# Patient Record
Sex: Female | Born: 1965 | Marital: Married | Smoking: Never smoker
Health system: Southern US, Community
[De-identification: ages and names within clinical notes are randomized; demographics above are authoritative.]

## PROBLEM LIST (undated history)

## (undated) DIAGNOSIS — E119 Type 2 diabetes mellitus without complications: Secondary | ICD-10-CM

## (undated) DIAGNOSIS — L709 Acne, unspecified: Secondary | ICD-10-CM

## (undated) DIAGNOSIS — S322XXA Fracture of coccyx, initial encounter for closed fracture: Secondary | ICD-10-CM

## (undated) DIAGNOSIS — M79671 Pain in right foot: Secondary | ICD-10-CM

## (undated) DIAGNOSIS — Z8719 Personal history of other diseases of the digestive system: Secondary | ICD-10-CM

## (undated) DIAGNOSIS — M75112 Incomplete rotator cuff tear or rupture of left shoulder, not specified as traumatic: Secondary | ICD-10-CM

## (undated) DIAGNOSIS — M6530 Trigger finger, unspecified finger: Secondary | ICD-10-CM

## (undated) DIAGNOSIS — M25561 Pain in right knee: Secondary | ICD-10-CM

## (undated) DIAGNOSIS — B009 Herpesviral infection, unspecified: Secondary | ICD-10-CM

## (undated) DIAGNOSIS — Z86010 Personal history of colonic polyps: Secondary | ICD-10-CM

## (undated) DIAGNOSIS — E785 Hyperlipidemia, unspecified: Secondary | ICD-10-CM

## (undated) DIAGNOSIS — K603 Anal fistula: Secondary | ICD-10-CM

## (undated) DIAGNOSIS — G8929 Other chronic pain: Secondary | ICD-10-CM

## (undated) DIAGNOSIS — Z8639 Personal history of other endocrine, nutritional and metabolic disease: Secondary | ICD-10-CM

## (undated) DIAGNOSIS — J383 Other diseases of vocal cords: Secondary | ICD-10-CM

## (undated) DIAGNOSIS — F419 Anxiety disorder, unspecified: Secondary | ICD-10-CM

## (undated) DIAGNOSIS — M542 Cervicalgia: Secondary | ICD-10-CM

## (undated) DIAGNOSIS — M7520 Bicipital tendinitis, unspecified shoulder: Secondary | ICD-10-CM

## (undated) DIAGNOSIS — G5603 Carpal tunnel syndrome, bilateral upper limbs: Secondary | ICD-10-CM

## (undated) DIAGNOSIS — M79672 Pain in left foot: Secondary | ICD-10-CM

## (undated) DIAGNOSIS — E049 Nontoxic goiter, unspecified: Secondary | ICD-10-CM

## (undated) HISTORY — PX: ABDOMINAL HYSTERECTOMY: SHX81

---
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
## 1990-07-12 HISTORY — PX: TUBAL LIGATION: SHX77

## 2007-07-13 HISTORY — PX: CHOLECYSTECTOMY: SHX55

## 2008-07-12 HISTORY — PX: GASTRIC BYPASS: SHX52

## 2014-07-12 HISTORY — PX: OTHER SURGICAL HISTORY: SHX169

## 2015-03-25 DIAGNOSIS — M75110 Incomplete rotator cuff tear or rupture of unspecified shoulder, not specified as traumatic: Secondary | ICD-10-CM | POA: Insufficient documentation

## 2015-04-17 DIAGNOSIS — S93431A Sprain of tibiofibular ligament of right ankle, initial encounter: Secondary | ICD-10-CM | POA: Insufficient documentation

## 2015-04-17 DIAGNOSIS — S8002XA Contusion of left knee, initial encounter: Secondary | ICD-10-CM | POA: Insufficient documentation

## 2015-07-13 HISTORY — PX: SHOULDER SURGERY: SHX246

## 2015-07-22 DIAGNOSIS — E119 Type 2 diabetes mellitus without complications: Secondary | ICD-10-CM | POA: Insufficient documentation

## 2015-08-02 DIAGNOSIS — M7522 Bicipital tendinitis, left shoulder: Secondary | ICD-10-CM | POA: Insufficient documentation

## 2016-07-12 HISTORY — PX: COLONOSCOPY W/ POLYPECTOMY: SHX1380

## 2017-09-13 DIAGNOSIS — M65352 Trigger finger, left little finger: Secondary | ICD-10-CM | POA: Insufficient documentation

## 2017-09-27 DIAGNOSIS — M79642 Pain in left hand: Secondary | ICD-10-CM

## 2017-09-27 DIAGNOSIS — M79641 Pain in right hand: Secondary | ICD-10-CM | POA: Insufficient documentation

## 2017-09-27 DIAGNOSIS — G5603 Carpal tunnel syndrome, bilateral upper limbs: Secondary | ICD-10-CM | POA: Insufficient documentation

## 2017-11-29 ENCOUNTER — Encounter: Payer: Self-pay | Admitting: Sports Medicine

## 2017-11-29 ENCOUNTER — Telehealth: Payer: Self-pay | Admitting: *Deleted

## 2017-11-29 ENCOUNTER — Ambulatory Visit: Payer: BLUE CROSS/BLUE SHIELD | Admitting: Sports Medicine

## 2017-11-29 ENCOUNTER — Other Ambulatory Visit: Payer: Self-pay | Admitting: Sports Medicine

## 2017-11-29 ENCOUNTER — Ambulatory Visit (INDEPENDENT_AMBULATORY_CARE_PROVIDER_SITE_OTHER): Payer: BLUE CROSS/BLUE SHIELD

## 2017-11-29 VITALS — Resp 16

## 2017-11-29 DIAGNOSIS — E1141 Type 2 diabetes mellitus with diabetic mononeuropathy: Secondary | ICD-10-CM | POA: Diagnosis not present

## 2017-11-29 DIAGNOSIS — M7742 Metatarsalgia, left foot: Secondary | ICD-10-CM

## 2017-11-29 DIAGNOSIS — M2041 Other hammer toe(s) (acquired), right foot: Secondary | ICD-10-CM | POA: Diagnosis not present

## 2017-11-29 DIAGNOSIS — M79671 Pain in right foot: Secondary | ICD-10-CM | POA: Diagnosis not present

## 2017-11-29 DIAGNOSIS — M21619 Bunion of unspecified foot: Secondary | ICD-10-CM

## 2017-11-29 DIAGNOSIS — M2042 Other hammer toe(s) (acquired), left foot: Principal | ICD-10-CM

## 2017-11-29 DIAGNOSIS — G629 Polyneuropathy, unspecified: Secondary | ICD-10-CM

## 2017-11-29 DIAGNOSIS — M79672 Pain in left foot: Secondary | ICD-10-CM

## 2017-11-29 DIAGNOSIS — M7741 Metatarsalgia, right foot: Secondary | ICD-10-CM | POA: Diagnosis not present

## 2017-11-29 NOTE — Progress Notes (Signed)
Subjective: Diane Hancock is a 52 y.o. female patient with history of diabetes who presents to office today complaining of painful lesion on right foot and burning and throbbing pain bilateral. Patient states that the glucose reading this morning was not recorded today. Last A1c 7.3. Patient denies any new changes in medication or new problems.  Review of Systems  Neurological: Positive for sensory change.  All other systems reviewed and are negative.    Patient Active Problem List   Diagnosis Date Noted  . Bilateral hand pain 09/27/2017  . Carpal tunnel syndrome, bilateral 09/27/2017  . Trigger little finger of left hand 09/13/2017  . Biceps tendinitis of left shoulder 08/02/2015  . Diabetes mellitus type 2, uncomplicated ([place]) 38/18/2993  . Contusion of left knee 04/17/2015  . Sprain of tibiofibular ligament of right ankle 04/17/2015  . Incomplete tear of rotator cuff 03/25/2015   Current Outpatient Medications on File Prior to Visit  Medication Sig Dispense Refill  . escitalopram (LEXAPRO) 10 MG tablet Take by mouth.    . fluticasone (FLONASE) 50 MCG/ACT nasal spray     . gabapentin (NEURONTIN) 300 MG capsule Reported on 07/30/2015    . meloxicam (MOBIC) 15 MG tablet     . methimazole (TAPAZOLE) 5 MG tablet Take by mouth.    . spironolactone (ALDACTONE) 50 MG tablet   0  . valACYclovir (VALTREX) 1000 MG tablet   1   No current facility-administered medications on file prior to visit.    No Known Allergies  No results found for this or any previous visit (from the past 2160 hour(s)).  Objective: General: Patient is awake, alert, and oriented x 3 and in no acute distress.  Integument: Skin is warm, dry and supple bilateral. Nails are within normal limits bilateral. No signs of infection. No open lesions. + minimal callus right foot sub met 2. Remaining integument unremarkable.  Vasculature: Dorsalis Pedis pulse 2/4 bilateral. Posterior Tibial pulse 2 /4 bilateral. Capillary  fill time <3 sec 1-5 bilateral. Positive hair growth to the level of the digits.Temperature gradient within normal limits. No varicosities present bilateral. No edema present bilateral.   Neurology: The patient has intact sensation measured with a 5.07/10g Semmes Weinstein Monofilament at all pedal sites bilateral . Vibratory sensation diminished bilateral with tuning fork. No Babinski sign present bilateral.   Musculoskeletal: Asymptomatic bunion, mild hammertoe, and occasional pain to balls to feet noted bilateral. Muscular strength 5/5 in all lower extremity muscular groups bilateral without pain on range of motion. No tenderness with calf compression bilateral.  Xrays: Mild hammertoe, no other acute findings.   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 -Xrays reviewed  -Discussed and educated patient on diabetic foot care, especially with  regards to the vascular, neurological and musculoskeletal systems.  -Stressed the importance of good glycemic control and the detriment of not  controlling glucose levels in relation to the foot. -Dispensed toe spacer to use 4-5 toes  -Recommend diabetic insoles to help with foot pain -Rx NCVs for further eval of foot pain of burning and tingling  -Return after nerve testing  -Patient advised to call  the office if any problems or questions arise in the meantime.  Titorya Stover, DPM 

## 2017-11-29 NOTE — Telephone Encounter (Signed)
-----   Message from Asencion Islam, [place] sent at 11/29/2017  2:01 PM EDT ----- Regarding: NCV Diabetic with increasing burning pain bilateral

## 2017-11-29 NOTE — Telephone Encounter (Signed)
Faxed orders to [facility]. 

## 2017-11-30 ENCOUNTER — Other Ambulatory Visit: Payer: Self-pay | Admitting: *Deleted

## 2017-11-30 ENCOUNTER — Encounter: Payer: Self-pay | Admitting: Neurology

## 2017-11-30 DIAGNOSIS — G629 Polyneuropathy, unspecified: Secondary | ICD-10-CM

## 2017-12-08 ENCOUNTER — Ambulatory Visit (INDEPENDENT_AMBULATORY_CARE_PROVIDER_SITE_OTHER): Payer: BLUE CROSS/BLUE SHIELD | Admitting: Neurology

## 2017-12-08 DIAGNOSIS — G629 Polyneuropathy, unspecified: Secondary | ICD-10-CM | POA: Diagnosis not present

## 2017-12-08 DIAGNOSIS — M79671 Pain in right foot: Secondary | ICD-10-CM

## 2017-12-08 DIAGNOSIS — M79672 Pain in left foot: Principal | ICD-10-CM

## 2017-12-08 NOTE — Procedures (Signed)
[facility]  [address] [place], [address]  [place], [place] [address] Tel: [phone] Fax:  [phone] Test Date:  12/08/2017  Patient: Diane Hancock DOB: [birth year] Physician: Nita Sickle, DO  Sex: Female Height:  Ref Phys: Asencion Islam, DPM  ID#: [id number] Temp: 34.4C Technician:    Patient Complaints: This is a 52 year old female with diabetes referred for evaluation of bilateral feet pain.  NCV & EMG Findings: Extensive electrodiagnostic testing of the right lower extremity and additional studies of the left shows:  1. Bilateral sural and superficial peroneal sensory responses are within normal limits. 2. Bilateral tibial and peroneal motor responses are within normal limits. 3. Bilateral tibial H reflex studies are within normal limits. 4. There is no evidence of active or chronic motor axon loss changes affecting any of the tested muscles. Motor unit configuration and recruitment pattern is within normal limits.  Impression: This is a normal study of the lower extremities. In particular, there is no evidence of a large fiber sensorimotor polyneuropathy or lumbosacral radiculopathy.    ___________________________ Nita Sickle, DO    Nerve Conduction Studies Anti Sensory Summary Table   Site NR Peak (ms) Norm Peak (ms) P-T Amp (V) Norm P-T Amp  Left Sup Peroneal Anti Sensory (Ant Lat Mall)  34.4C  12 cm    3.0 <4.6 13.4 >4  Right Sup Peroneal Anti Sensory (Ant Lat Mall)  34.4C  12 cm    2.3 <4.6 14.2 >4  Left Sural Anti Sensory (Lat Mall)  34.4C  Calf    3.5 <4.6 14.8 >4  Right Sural Anti Sensory (Lat Mall)  34.4C  Calf    3.1 <4.6 14.8 >4   Motor Summary Table   Site NR Onset (ms) Norm Onset (ms) O-P Amp (mV) Norm O-P Amp Site1 Site2 Delta-0 (ms) Dist (cm) Vel (m/s) Norm Vel (m/s)  Left Peroneal Motor (Ext Dig Brev)  34.4C  Ankle    3.7 <6.0 7.2 >2.5 B Fib Ankle 7.3 39.0 53 >40  B Fib    11.0  6.4  Poplt B Fib 1.5 9.0 60 >40  Poplt     12.5  6.4         Right Peroneal Motor (Ext Dig Brev)  34.4C  Ankle    3.0 <6.0 6.7 >2.5 B Fib Ankle 8.1 39.0 48 >40  B Fib    11.1  5.8  Poplt B Fib 1.6 8.0 50 >40  Poplt    12.7  5.5         Left Tibial Motor (Abd Hall Brev)  34.4C  Ankle    4.1 <6.0 8.5 >4 Knee Ankle 9.7 41.0 42 >40  Knee    13.8  5.6         Right Tibial Motor (Abd Hall Brev)  34.4C  Ankle    4.1 <6.0 10.2 >4 Knee Ankle 8.7 40.0 46 >40  Knee    12.8  7.7          H Reflex Studies   NR H-Lat (ms) Lat Norm (ms) L-R H-Lat (ms)  Left Tibial (Gastroc)  34.4C     34.01 <35 3.67  Right Tibial (Gastroc)  34.4C     30.34 <35 3.67   EMG   Side Muscle Ins Act Fibs Psw Fasc Number Recrt Dur Dur. Amp Amp. Poly Poly. Comment  Right AntTibialis Nml Nml Nml Nml Nml Nml Nml Nml Nml Nml Nml Nml N/A  Right Gastroc Nml Nml Nml Nml Nml  Nml Nml Nml Nml Nml Nml Nml N/A  Right Flex Dig Long Nml Nml Nml Nml Nml Nml Nml Nml Nml Nml Nml Nml N/A  Right RectFemoris Nml Nml Nml Nml Nml Nml Nml Nml Nml Nml Nml Nml N/A  Right GluteusMed Nml Nml Nml Nml Nml Nml Nml Nml Nml Nml Nml Nml N/A  Left AntTibialis Nml Nml Nml Nml Nml Nml Nml Nml Nml Nml Nml Nml N/A  Left Gastroc Nml Nml Nml Nml Nml Nml Nml Nml Nml Nml Nml Nml N/A  Left Flex Dig Long Nml Nml Nml Nml Nml Nml Nml Nml Nml Nml Nml Nml N/A  Left GluteusMed Nml Nml Nml Nml Nml Nml Nml Nml Nml Nml Nml Nml N/A  Left RectFemoris Nml Nml Nml Nml Nml Nml Nml Nml Nml Nml Nml Nml N/A      Waveforms:

## 2017-12-09 NOTE — Telephone Encounter (Signed)
-----   Message from Asencion Islam, [place] sent at 12/08/2017  1:05 PM EDT ----- Regarding: NCV Results Diane Hancock We let patient know that her nerve conduction test was normal there is no evidence of any large nerve conduction issue however her symptoms of numbness tingling burning may be associated with small fiber neuropathy.  The next best test to confirm small fiber neuropathy would be for me to do a skin biopsy to send this biopsy to the lab if patient is interested in this then to have her come to office next available appointment for nerve biopsy to be performed for further evaluation for [place] to discuss any other ways that could help with her likely small fiber neuropathy symptoms. -Dr. Marylene Land

## 2017-12-09 NOTE — Telephone Encounter (Signed)
Left message for pt to call for results  

## 2017-12-12 ENCOUNTER — Telehealth: Payer: Self-pay | Admitting: Sports Medicine

## 2017-12-12 NOTE — Telephone Encounter (Signed)
I'm returning a call to WaileaValery. Joya SanValery, you can call me back at [phone]. I'm in [place] today so if you need to leave a message, please feel free to leave a message on that number as I'm the only one with access to it. If its to schedule an appointment to go over my results, then I will be available anytime Friday. Thank you.

## 2017-12-12 NOTE — Telephone Encounter (Signed)
I informed pt of Dr.Stover's review of results and recommendations. Pt states she would like to have the nerve biopsies performed. I told pt I would make sure we had the lab supplies and have a scheduler contact her.

## 2017-12-13 NOTE — Telephone Encounter (Signed)
Thanks please make sure for this patient appt we have 30 mins to do the nerve biopsy procedure -Dr. Marylene LandStover

## 2017-12-13 NOTE — Telephone Encounter (Signed)
I informed pt, Dr. Marylene LandStover had a procedure time available and pt could schedule. Transferred to schedulers.

## 2018-01-03 ENCOUNTER — Ambulatory Visit: Payer: BLUE CROSS/BLUE SHIELD | Admitting: Sports Medicine

## 2018-01-03 ENCOUNTER — Encounter: Payer: Self-pay | Admitting: Sports Medicine

## 2018-01-03 DIAGNOSIS — G629 Polyneuropathy, unspecified: Secondary | ICD-10-CM

## 2018-01-03 DIAGNOSIS — E1141 Type 2 diabetes mellitus with diabetic mononeuropathy: Secondary | ICD-10-CM

## 2018-01-03 DIAGNOSIS — M79671 Pain in right foot: Secondary | ICD-10-CM

## 2018-01-03 DIAGNOSIS — M79672 Pain in left foot: Secondary | ICD-10-CM

## 2018-01-03 NOTE — Progress Notes (Signed)
Subjective: Diane Hancock is a 52 y.o. female patient with history of diabetes who returns to office today complaining of burning and throbbing pain bilateral. Patient had NCVs performed and is here to discuss results. Patient is also here for nerve biopsy as well for further evaluation of nerve pain. Patient states that her pain in feet all over is 7/10 and admits that she even has pain in her hands too with fingers going numb. Patient states that the glucose reading this morning was not recorded. Last A1c 7.3 as noted last month. Patient denies any new changes in medication or new problems since last visit.  Reports history of traumatic fall injury to tail bone that caused her to have symptoms afterwards.   Patient Active Problem List   Diagnosis Date Noted  . Bilateral hand pain 09/27/2017  . Carpal tunnel syndrome, bilateral 09/27/2017  . Trigger little finger of left hand 09/13/2017  . Biceps tendinitis of left shoulder 08/02/2015  . Diabetes mellitus type 2, uncomplicated ([place]) 44/09/4740  . Contusion of left knee 04/17/2015  . Sprain of tibiofibular ligament of right ankle 04/17/2015  . Incomplete tear of rotator cuff 03/25/2015   Current Outpatient Medications on File Prior to Visit  Medication Sig Dispense Refill  . escitalopram (LEXAPRO) 10 MG tablet Take by mouth.    . fluticasone (FLONASE) 50 MCG/ACT nasal spray     . gabapentin (NEURONTIN) 300 MG capsule Reported on 07/30/2015    . meloxicam (MOBIC) 15 MG tablet     . methimazole (TAPAZOLE) 5 MG tablet Take by mouth.    . spironolactone (ALDACTONE) 50 MG tablet   0  . valACYclovir (VALTREX) 1000 MG tablet   1   No current facility-administered medications on file prior to visit.    No Known Allergies  No results found for this or any previous visit (from the past 2160 hour(s)).  Objective: General: Patient is awake, alert, and oriented x 3 and in no acute distress.  Integument: Skin is warm, dry and supple bilateral.  Nails are within normal limits bilateral. No signs of infection. No open lesions. + minimal callus right foot sub met 2. Remaining integument unremarkable.  Vasculature: Dorsalis Pedis pulse 2/4 bilateral. Posterior Tibial pulse 2 /4 bilateral. Capillary fill time <3 sec 1-5 bilateral. Positive hair growth to the level of the digits.Temperature gradient within normal limits. No varicosities present bilateral. No edema present bilateral.   Neurology: The patient has intact sensation measured with a 5.07/10g Semmes Weinstein Monofilament at all pedal sites bilateral . Vibratory sensation diminished bilateral with tuning fork. No Babinski sign present bilateral.   Musculoskeletal: Asymptomatic bunion, mild hammertoe, and occasional pain to balls to feet noted bilateral. Muscular strength 5/5 in all lower extremity muscular groups bilateral without pain on range of motion. No tenderness with calf compression bilateral.  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 -NCVs reviewed  -Discussed with patient likelihood of small fiber neuropathy. Patient elects at this time for nerve biopsy. Using sterile technique, prepped the biopsy area with alcohol and injected 1cc of lidocaine with epi in a field block fashion 10cm  above the lateral malleolus on the left and right lower extremity. After anesthesia was obtained. Prepped the skin with betadine and then use Bako kit a 58m punch biospy was taken from both the left and right lower extremity. Hemostasis was achieved using lumicane and area was dressed with antibiotic cream, 2x2 guaze and bandaid dressing. Patient tolerated the procedure and anesthesia well. Advised patient to dress areas daily with  antibiotic cream and bandaid until healed.  -Advised patient to monitor sites for infection and if noted to call or return to office -Return in 3 weeks for discussion of biopsy results and plan of care for neuropathy.   TLandis Martins DPM

## 2018-01-24 ENCOUNTER — Ambulatory Visit: Payer: BLUE CROSS/BLUE SHIELD | Admitting: Sports Medicine

## 2018-01-24 ENCOUNTER — Encounter: Payer: Self-pay | Admitting: Sports Medicine

## 2018-01-24 DIAGNOSIS — G629 Polyneuropathy, unspecified: Secondary | ICD-10-CM

## 2018-01-24 DIAGNOSIS — E1141 Type 2 diabetes mellitus with diabetic mononeuropathy: Secondary | ICD-10-CM

## 2018-01-24 DIAGNOSIS — M79672 Pain in left foot: Secondary | ICD-10-CM

## 2018-01-24 DIAGNOSIS — M79671 Pain in right foot: Secondary | ICD-10-CM

## 2018-01-24 NOTE — Progress Notes (Signed)
  Subjective: Diane Hancock is a 52 y.o. female patient who presents to office for followup evaluation of bilateral foot pain numbness, tingling and for discussion of biopsy resilts. Patient states that areas are healing well; a little redness and soreness on right biopsy site but denies any other issues.   Patient Active Problem List   Diagnosis Date Noted  . Bilateral hand pain 09/27/2017  . Carpal tunnel syndrome, bilateral 09/27/2017  . Trigger little finger of left hand 09/13/2017  . Biceps tendinitis of left shoulder 08/02/2015  . Diabetes mellitus type 2, uncomplicated ([place]) 25/42/7062  . Contusion of left knee 04/17/2015  . Sprain of tibiofibular ligament of right ankle 04/17/2015  . Incomplete tear of rotator cuff 03/25/2015    Current Outpatient Medications on File Prior to Visit  Medication Sig Dispense Refill  . escitalopram (LEXAPRO) 10 MG tablet Take by mouth.    . fluticasone (FLONASE) 50 MCG/ACT nasal spray     . gabapentin (NEURONTIN) 300 MG capsule Reported on 07/30/2015    . meloxicam (MOBIC) 15 MG tablet     . methimazole (TAPAZOLE) 5 MG tablet Take by mouth.    . spironolactone (ALDACTONE) 50 MG tablet   0  . valACYclovir (VALTREX) 1000 MG tablet   1   No current facility-administered medications on file prior to visit.     No Known Allergies  Objective:  General: Alert and oriented x3 in no acute distress Integument: Skin is warm, dry and supple bilateral. Nails are within normal limits bilateral. No signs of infection. No open lesions. + minimal callus right foot sub met 2. Remaining integument unremarkable.  Vasculature: Dorsalis Pedis pulse 2/4 bilateral. Posterior Tibial pulse 2 /4 bilateral. Capillary fill time <3 sec 1-5 bilateral. Positive hair growth to the level of the digits.Temperature gradient within normal limits. No varicosities present bilateral. No edema present bilateral.   Neurology: The patient has intact sensation measured with a  5.07/10g Semmes Weinstein Monofilament at all pedal sites bilateral . Vibratory sensation diminished bilateral with tuning fork. No Babinski sign present bilateral.   Musculoskeletal: Asymptomatic bunion, mild hammertoe, and occasional pain to balls to feet noted bilateral. Muscular strength 5/5 in all lower extremity muscular groups bilateral without pain on range of motion. No tenderness with calf compression bilateral.  ENFD Testing- Degeneration but normal count, recommend another bx in 8moto 1 year.     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  TiLandis MartinsDPM

## 2018-01-30 ENCOUNTER — Ambulatory Visit: Payer: Self-pay | Admitting: Surgery

## 2018-01-30 NOTE — H&P (Signed)
CC: Here for f/u regarding possible anal fistula  HPI: [name] is a very pleasant 52yoF with hx of DM, HTN, HSV here today for follow-up regarding perianal pain. She first noticed a nodule in her left buttock that developed 11/19/17. She described this is uncomfortable. She was given a course of oral antibiotics thinking this was a possible abscess but nothing seemed to completely go away. She notes this changes in size based on her bowel movements but doesn't appear to be draining any purulent fluid. It does occasionally bleed. She reports having a colonoscopy over in [place] last year and being told there were a couple of benign polyps were removed but it was otherwise unremarkable. She notes that the lesion decreases in size on her bowel movements are loose and gets bigger when she is constipated. She denies any recent fever/chills.  PMH: DM (well controlled on metformin), HTN (well controlled with spironolactone), HSV (well controlled with prn valacyclovir)  PSH: Denies any prior anorectal procedures  FHx: Denies FHx of malignancy  Social: Denies use of tobacco/EtOH/drugs. She stays at home and helps care for her grandchildren-her son is a General surgery PA with [employer].  ROS: A comprehensive 10 system review of systems was completed with the patient and pertinent findings as noted above.  The patient is a 52 year old female.   Medication History (Armen Ferguson, CMA; 01/30/2018 2:42 PM) Crestor (5MG  Tablet, Oral) Active. Escitalopram Oxalate (20MG  Tablet, Oral) Active. ValACYclovir HCl (1GM Tablet, Oral) Active. Spironolactone (50MG  Tablet, Oral) Active. MetFORMIN HCl (500MG  Tablet, Oral) Active. Medications Reconciled    Review of Systems Cristal Deer(Alif Petrak M. Makaiyah Schweiger MD; 01/30/2018 2:58 PM) General Not Present- Appetite Loss, Chills, Fever, Weight Gain and Weight Loss. Skin Present- Rash. Not Present- Change in Wart/Mole, Dryness, Hives, Jaundice, New Lesions, Non-Healing  Wounds and Ulcer. HEENT Present- Seasonal Allergies and Wears glasses/contact lenses. Not Present- Earache, Hearing Loss, Hoarseness, Nose Bleed, Oral Ulcers, Ringing in the Ears, Sinus Pain, Sore Throat, Visual Disturbances and Yellow Eyes. Respiratory Not Present- Bloody sputum, Chronic Cough, Difficulty Breathing, Snoring and Wheezing. Breast Not Present- Breast Mass, Breast Pain, Nipple Discharge and Skin Changes. Cardiovascular Present- Swelling of Extremities. Not Present- Chest Pain, Difficulty Breathing Lying Down, Leg Cramps, Palpitations, Rapid Heart Rate and Shortness of Breath. Gastrointestinal Present- Excessive gas. Not Present- Abdominal Pain, Bloating, Bloody Stool, Change in Bowel Habits, Chronic diarrhea, Constipation, Difficulty Swallowing, Gets full quickly at meals, Hemorrhoids, Indigestion, Nausea, Rectal Pain and Vomiting. Female Genitourinary Present- Pelvic Pain and Urgency. Not Present- Frequency, Nocturia and Painful Urination. Musculoskeletal Present- Back Pain, Joint Pain, Joint Stiffness, Muscle Pain and Muscle Weakness. Neurological Present- Numbness, Tingling and Weakness. Not Present- Decreased Memory, Fainting, Headaches, Seizures, Tremor and Trouble walking. Psychiatric Present- Anxiety. Not Present- Bipolar, Change in Sleep Pattern, Depression, Fearful and Frequent crying. Endocrine Not Present- Cold Intolerance, Excessive Hunger, Hair Changes, Heat Intolerance, Hot flashes and New Diabetes. Hematology Not Present- Blood Thinners, Easy Bruising, Excessive bleeding, Gland problems, HIV and Persistent Infections.  Vitals (Armen Ferguson CMA; 01/30/2018 2:41 PM) 01/30/2018 2:41 PM Weight: 190.5 lb Height: 68in Body Surface Area: 2 m Body Mass Index: 28.97 kg/m  Temp.: 97.69F  Pulse: 96 (Regular)  P.OX: 96% (Room air) BP: 140/78 (Sitting, Left Arm, Standard)       Physical Exam Cristal Deer(Doyal Saric M. Shiheem Corporan MD; 01/30/2018 3:15 PM) The physical exam  findings are as follows: Note:Constitutional: No acute distress; conversant; no deformities Eyes: Moist conjunctiva; no lid lag; anicteric sclerae; pupils equal round and reactive to  light Neck: Trachea midline; no palpable thyromegaly Lungs: Normal respiratory effort; no tactile fremitus CV: Regular rate and rhythm; no palpable thrill; no pitting edema GI: Abdomen soft, nontender, nondistended; no palpable hepatosplenomegaly Anorectal: Left anterior external opening consistent with fistula. Palpable cord extending toward anus. DRE - no palpable abnormalities. Anoscopy: No visible internal opening. Small internal hemorrhoids. No ulcerations MSK: Normal gait; no clubbing/cyanosis Psychiatric: Appropriate affect; alert and oriented 3 Lymphatic: No palpable cervical or axillary lymphadenopathy **A chaperone, Theodora Blow, was present for the entire physical exam    Assessment & Plan Cristal Deer M. Alen Matheson MD; 01/30/2018 3:20 PM) ANAL FISTULA (K60.3) Story: Ms. Hanaway is a very pleasant 52yoF with hx of HTN, DM, HSV here with anal fistula - external opening left anterior Impression: -The anatomy and physiology of the anal canal was discussed at length with the patient. The pathophysiology of anal abscess and fistula was discussed at length with associated pictures and illustrations using the ASCRS handout -We discussed options for treatment moving forward including further observation versus surgery. Surgery would involve examination under anesthesia, possible incision and drainage of perianal abscess, possible fistulotomy, possible seton. We discussed risks of nonoperative management including recurrent perianal abscesses and failure to resolve. She opted to pursue surgery. -The planned procedure, material risks (including, but not limited to, pain, bleeding, infection, scarring, need for blood transfusion, damage to anal sphincter, incontinence of gas and/or stool, need for additional procedures,  recurrence, pneumonia, heart attack, stroke, death) benefits and alternatives to surgery were discussed at length. We discussed that surgery for fistulas can involve multiple operations in order to get to resolve. The patient's questions were answered to her satisfaction, she voiced understanding and elected to proceed with surgery. Additionally, we discussed typical postoperative expectations and the recovery process. Current Plans ANOSCOPY, DIAGNOSTIC [phone]) (Anorectal: Left anterior external opening consistent with fistula. Palpable cord extending toward anus. DRE - no palpable abnormalities.; Anoscopy: No visible internal opening. Small internal hemorrhoids. No ulcerations) Signed electronically by Andria Meuse, MD (01/30/2018 3:20 PM)

## 2018-02-13 DIAGNOSIS — M79676 Pain in unspecified toe(s): Secondary | ICD-10-CM

## 2018-02-21 ENCOUNTER — Ambulatory Visit: Payer: BLUE CROSS/BLUE SHIELD | Admitting: Sports Medicine

## 2018-02-21 ENCOUNTER — Encounter: Payer: Self-pay | Admitting: Sports Medicine

## 2018-02-21 DIAGNOSIS — M779 Enthesopathy, unspecified: Secondary | ICD-10-CM

## 2018-02-21 DIAGNOSIS — M79671 Pain in right foot: Secondary | ICD-10-CM

## 2018-02-21 DIAGNOSIS — M7751 Other enthesopathy of right foot: Secondary | ICD-10-CM

## 2018-02-21 DIAGNOSIS — M778 Other enthesopathies, not elsewhere classified: Secondary | ICD-10-CM

## 2018-02-21 DIAGNOSIS — E1141 Type 2 diabetes mellitus with diabetic mononeuropathy: Secondary | ICD-10-CM

## 2018-02-21 MED ORDER — TRIAMCINOLONE ACETONIDE 10 MG/ML IJ SUSP: 10.0000 mg | Freq: Once | INTRAMUSCULAR | Status: DC

## 2018-02-21 NOTE — Progress Notes (Signed)
Subjective: Diane Hancock is a 52 y.o. female patient who presents to office for evaluation of Right foot pain. Patient complains of progressive pain especially since yesterday after doing yard work and water hose pressing on bottom of foot . Ranks pain 7/10 when trying to get out of bed in the AM. Patient has tried nothing for her symptoms. Patient denies any other pedal complaints. Denies acute major injury/trip/fall/sprain/any other causative factors.   FBS not recorded.  Patient Active Problem List   Diagnosis Date Noted  . Bilateral hand pain 09/27/2017  . Carpal tunnel syndrome, bilateral 09/27/2017  . Trigger little finger of left hand 09/13/2017  . Biceps tendinitis of left shoulder 08/02/2015  . Diabetes mellitus type 2, uncomplicated ([place]) 98/26/4158  . Contusion of left knee 04/17/2015  . Sprain of tibiofibular ligament of right ankle 04/17/2015  . Incomplete tear of rotator cuff 03/25/2015    Current Outpatient Medications on File Prior to Visit  Medication Sig Dispense Refill  . escitalopram (LEXAPRO) 10 MG tablet Take by mouth.    . fluticasone (FLONASE) 50 MCG/ACT nasal spray     . gabapentin (NEURONTIN) 300 MG capsule Reported on 07/30/2015    . meloxicam (MOBIC) 15 MG tablet     . methimazole (TAPAZOLE) 5 MG tablet Take by mouth.    . spironolactone (ALDACTONE) 50 MG tablet   0  . valACYclovir (VALTREX) 1000 MG tablet   1   No current facility-administered medications on file prior to visit.     No Known Allergies  Objective:  General: Alert and oriented x3 in no acute distress  Dermatology: No open lesions bilateral lower extremities, no webspace macerations, no ecchymosis bilateral, all nails x 10 are well manicured.  Vascular: Dorsalis Pedis and Posterior Tibial pedal pulses palpable, Capillary Fill Time 3 seconds,(+) pedal hair growth bilateral, no edema bilateral lower extremities, Temperature gradient within normal limits.  Neurology: Johney Maine sensation  intact via light touch bilateral, Protective sensation intact with Thornell Mule Monofilament to all pedal sites, Vibratory diminished bilateral.  Musculoskeletal: Mild tenderness with palpation at [facility] of right foot sub met 2-3,No pain with calf compression bilateral. Strength within normal limits in all groups bilateral.   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 Post-injection care discussed with patient.  -Applied metatarsal removal strapping to right foot -Recommend gentle stretching and icing as needed -Patient to return to office as needed or sooner if condition worsens. If pain recurs will xray at next visit.  Landis Martins, DPM

## 2018-02-24 ENCOUNTER — Encounter (INDEPENDENT_AMBULATORY_CARE_PROVIDER_SITE_OTHER): Payer: Self-pay | Admitting: Orthopaedic Surgery

## 2018-02-24 ENCOUNTER — Encounter (INDEPENDENT_AMBULATORY_CARE_PROVIDER_SITE_OTHER): Payer: Self-pay | Admitting: Radiology

## 2018-03-03 ENCOUNTER — Ambulatory Visit (INDEPENDENT_AMBULATORY_CARE_PROVIDER_SITE_OTHER): Payer: BLUE CROSS/BLUE SHIELD | Admitting: Orthopaedic Surgery

## 2018-03-14 ENCOUNTER — Other Ambulatory Visit: Payer: Self-pay

## 2018-03-14 ENCOUNTER — Ambulatory Visit (INDEPENDENT_AMBULATORY_CARE_PROVIDER_SITE_OTHER): Payer: Self-pay

## 2018-03-14 ENCOUNTER — Ambulatory Visit (INDEPENDENT_AMBULATORY_CARE_PROVIDER_SITE_OTHER): Payer: BLUE CROSS/BLUE SHIELD | Admitting: Orthopaedic Surgery

## 2018-03-14 ENCOUNTER — Encounter (INDEPENDENT_AMBULATORY_CARE_PROVIDER_SITE_OTHER): Payer: Self-pay | Admitting: Orthopaedic Surgery

## 2018-03-14 ENCOUNTER — Encounter (HOSPITAL_BASED_OUTPATIENT_CLINIC_OR_DEPARTMENT_OTHER): Payer: Self-pay

## 2018-03-14 VITALS — BP 128/81 | HR 67 | Ht 68.0 in | Wt 190.0 lb

## 2018-03-14 DIAGNOSIS — M25561 Pain in right knee: Secondary | ICD-10-CM | POA: Diagnosis not present

## 2018-03-14 DIAGNOSIS — G8929 Other chronic pain: Secondary | ICD-10-CM | POA: Diagnosis not present

## 2018-03-14 DIAGNOSIS — M545 Low back pain: Secondary | ICD-10-CM

## 2018-03-14 DIAGNOSIS — M542 Cervicalgia: Secondary | ICD-10-CM
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
## 2018-03-14 NOTE — Progress Notes (Signed)
Spoke with:  Jasmine December NPO:  After Midnight, no gum, candy, or mints  Arrival time:  0715AM Labs:  (CBC, CMP, PT, PTT, EKG 03/15/2018 in epic) AM medications:  None Pre op orders: Yes Ride home:  Reita Cliche (husband) [phone]

## 2018-03-14 NOTE — Pre-Procedure Instructions (Signed)
Diane Hancock instructed to use fleet enema as instructed by Dr. Cliffton Asters.

## 2018-03-15 ENCOUNTER — Encounter (HOSPITAL_COMMUNITY)
Admission: RE | Admit: 2018-03-15 | Discharge: 2018-03-15 | Disposition: A | Discharge status: Home / Self Care | Payer: BLUE CROSS/BLUE SHIELD | Source: Ambulatory Visit | Attending: Surgery | Admitting: Surgery

## 2018-03-15 DIAGNOSIS — Z01810 Encounter for preprocedural cardiovascular examination: Secondary | ICD-10-CM | POA: Insufficient documentation

## 2018-03-15 DIAGNOSIS — Z01812 Encounter for preprocedural laboratory examination: Secondary | ICD-10-CM | POA: Diagnosis present

## 2018-03-15 LAB — CBC WITH DIFFERENTIAL/PLATELET
BASOS ABS: 0 10*3/uL (ref 0.0–0.1)
BASOS PCT: 1 %
EOS ABS: 0.2 10*3/uL (ref 0.0–0.7)
Eosinophils Relative: 3 %
HCT: 38.4 % (ref 36.0–46.0)
HEMOGLOBIN: 13 g/dL (ref 12.0–15.0)
Lymphocytes Relative: 39 %
Lymphs Abs: 2.5 10*3/uL (ref 0.7–4.0)
MCH: 28 pg (ref 26.0–34.0)
MCHC: 33.9 g/dL (ref 30.0–36.0)
MCV: 82.6 fL (ref 78.0–100.0)
MONOS PCT: 6 %
Monocytes Absolute: 0.4 10*3/uL (ref 0.1–1.0)
NEUTROS PCT: 51 %
Neutro Abs: 3.3 10*3/uL (ref 1.7–7.7)
PLATELETS: 269 10*3/uL (ref 150–400)
RBC: 4.65 MIL/uL (ref 3.87–5.11)
RDW: 13.5 % (ref 11.5–15.5)
WBC: 6.4 10*3/uL (ref 4.0–10.5)

## 2018-03-15 LAB — COMPREHENSIVE METABOLIC PANEL
ALT: 35 U/L (ref 0–44)
AST: 32 U/L (ref 15–41)
Albumin: 4 g/dL (ref 3.5–5.0)
Alkaline Phosphatase: 84 U/L (ref 38–126)
Anion gap: 9 (ref 5–15)
BUN: 13 mg/dL (ref 6–20)
CALCIUM: 9.2 mg/dL (ref 8.9–10.3)
CO2: 29 mmol/L (ref 22–32)
CREATININE: 0.67 mg/dL (ref 0.44–1.00)
Chloride: 100 mmol/L (ref 98–111)
Glucose, Bld: 266 mg/dL — ABNORMAL HIGH (ref 70–99)
Potassium: 3.8 mmol/L (ref 3.5–5.1)
Sodium: 138 mmol/L (ref 135–145)
Total Bilirubin: 0.5 mg/dL (ref 0.3–1.2)
Total Protein: 7 g/dL (ref 6.5–8.1)

## 2018-03-15 LAB — APTT: APTT: 28 s (ref 24–36)

## 2018-03-15 LAB — PROTIME-INR
INR: 0.97
PROTHROMBIN TIME: 12.8 s (ref 11.4–15.2)

## 2018-03-15 NOTE — Pre-Procedure Instructions (Signed)
CMP results 03/15/2018 faxed to Dr. Cliffton Asters via epic.

## 2018-03-17 ENCOUNTER — Ambulatory Visit (HOSPITAL_BASED_OUTPATIENT_CLINIC_OR_DEPARTMENT_OTHER): Payer: BLUE CROSS/BLUE SHIELD | Admitting: Anesthesiology

## 2018-03-17 ENCOUNTER — Ambulatory Visit (HOSPITAL_BASED_OUTPATIENT_CLINIC_OR_DEPARTMENT_OTHER)
Admission: RE | Admit: 2018-03-17 | Discharge: 2018-03-17 | Disposition: A | Discharge status: Home / Self Care | Payer: BLUE CROSS/BLUE SHIELD | Source: Ambulatory Visit | Attending: Surgery | Admitting: Surgery

## 2018-03-17 ENCOUNTER — Encounter (HOSPITAL_BASED_OUTPATIENT_CLINIC_OR_DEPARTMENT_OTHER)
Admission: RE | Disposition: A | Discharge status: Home / Self Care | Payer: Self-pay | Source: Ambulatory Visit | Attending: Surgery

## 2018-03-17 ENCOUNTER — Other Ambulatory Visit: Payer: Self-pay

## 2018-03-17 ENCOUNTER — Encounter (HOSPITAL_BASED_OUTPATIENT_CLINIC_OR_DEPARTMENT_OTHER): Payer: Self-pay

## 2018-03-17 DIAGNOSIS — E785 Hyperlipidemia, unspecified: Secondary | ICD-10-CM | POA: Insufficient documentation

## 2018-03-17 DIAGNOSIS — Z7984 Long term (current) use of oral hypoglycemic drugs: Secondary | ICD-10-CM | POA: Diagnosis not present

## 2018-03-17 DIAGNOSIS — B009 Herpesviral infection, unspecified: Secondary | ICD-10-CM | POA: Insufficient documentation

## 2018-03-17 DIAGNOSIS — Z9884 Bariatric surgery status: Secondary | ICD-10-CM | POA: Insufficient documentation

## 2018-03-17 DIAGNOSIS — Z7951 Long term (current) use of inhaled steroids: Secondary | ICD-10-CM | POA: Insufficient documentation

## 2018-03-17 DIAGNOSIS — I10 Essential (primary) hypertension: Secondary | ICD-10-CM | POA: Diagnosis not present

## 2018-03-17 DIAGNOSIS — F419 Anxiety disorder, unspecified: Secondary | ICD-10-CM | POA: Insufficient documentation

## 2018-03-17 DIAGNOSIS — Z79899 Other long term (current) drug therapy: Secondary | ICD-10-CM | POA: Insufficient documentation

## 2018-03-17 DIAGNOSIS — K603 Anal fistula: Secondary | ICD-10-CM | POA: Insufficient documentation

## 2018-03-17 DIAGNOSIS — E119 Type 2 diabetes mellitus without complications: Secondary | ICD-10-CM | POA: Diagnosis not present

## 2018-03-17 HISTORY — DX: Anal fistula: K60.3

## 2018-03-17 HISTORY — DX: Anxiety disorder, unspecified: F41.9

## 2018-03-17 HISTORY — DX: Cervicalgia: M54.2

## 2018-03-17 HISTORY — DX: Type 2 diabetes mellitus without complications: E11.9

## 2018-03-17 HISTORY — DX: Personal history of other endocrine, nutritional and metabolic disease: Z86.39

## 2018-03-17 HISTORY — DX: Acne, unspecified: L70.9

## 2018-03-17 HISTORY — DX: Incomplete rotator cuff tear or rupture of left shoulder, not specified as traumatic: M75.112

## 2018-03-17 HISTORY — DX: Pain in right foot: M79.671

## 2018-03-17 HISTORY — DX: Pain in right knee: M25.561

## 2018-03-17 HISTORY — DX: Carpal tunnel syndrome, bilateral upper limbs: G56.03

## 2018-03-17 HISTORY — DX: Hyperlipidemia, unspecified: E78.5

## 2018-03-17 HISTORY — DX: Nontoxic goiter, unspecified: E04.9

## 2018-03-17 HISTORY — DX: Trigger finger, unspecified finger: M65.30

## 2018-03-17 HISTORY — DX: Herpesviral infection, unspecified: B00.9

## 2018-03-17 HISTORY — DX: Fracture of coccyx, initial encounter for closed fracture: S32.2XXA

## 2018-03-17 HISTORY — DX: Other diseases of vocal cords: J38.3

## 2018-03-17 HISTORY — DX: Bicipital tendinitis, unspecified shoulder: M75.20

## 2018-03-17 HISTORY — DX: Pain in left foot: M79.672

## 2018-03-17 HISTORY — DX: Personal history of colonic polyps: Z86.010

## 2018-03-17 HISTORY — DX: Personal history of other diseases of the digestive system: Z87.19

## 2018-03-17 HISTORY — DX: Other chronic pain: G89.29

## 2018-03-17 LAB — GLUCOSE, CAPILLARY
GLUCOSE-CAPILLARY: 233 mg/dL — AB (ref 70–99)
GLUCOSE-CAPILLARY: 211 mg/dL — AB (ref 70–99)

## 2018-03-17 SURGERY — EXAM UNDER ANESTHESIA
Anesthesia: General | Site: Rectum

## 2018-03-17 MED ORDER — EPHEDRINE 5 MG/ML INJ
INTRAVENOUS | Status: AC
  Filled 2018-03-17: qty 10

## 2018-03-17 MED ORDER — BUPIVACAINE LIPOSOME 1.3 % IJ SUSP
INTRAMUSCULAR | Status: DC | PRN
  Administered 2018-03-17: 20 mL

## 2018-03-17 MED ORDER — HYDROGEN PEROXIDE 3 % EX SOLN
CUTANEOUS | Status: DC | PRN
  Administered 2018-03-17: 1

## 2018-03-17 MED ORDER — FENTANYL CITRATE (PF) 100 MCG/2ML IJ SOLN
INTRAMUSCULAR | Status: DC | PRN
  Administered 2018-03-17 (×2): 50 ug via INTRAVENOUS

## 2018-03-17 MED ORDER — GLYCOPYRROLATE PF 0.2 MG/ML IJ SOSY
PREFILLED_SYRINGE | INTRAMUSCULAR | Status: DC | PRN
  Administered 2018-03-17: .2 mg via INTRAVENOUS

## 2018-03-17 MED ORDER — LACTATED RINGERS IV SOLN
INTRAVENOUS | Status: DC
  Administered 2018-03-17 (×2): via INTRAVENOUS
  Filled 2018-03-17: qty 1000

## 2018-03-17 MED ORDER — PROPOFOL 10 MG/ML IV BOLUS
INTRAVENOUS | Status: AC
  Filled 2018-03-17: qty 20

## 2018-03-17 MED ORDER — ACETAMINOPHEN 500 MG PO TABS
1000.0000 mg | ORAL_TABLET | ORAL | Status: AC
  Administered 2018-03-17: 1000 mg via ORAL
  Filled 2018-03-17: qty 2

## 2018-03-17 MED ORDER — HEPARIN SODIUM (PORCINE) 5000 UNIT/ML IJ SOLN
INTRAMUSCULAR | Status: AC
  Filled 2018-03-17: qty 1

## 2018-03-17 MED ORDER — PROPOFOL 10 MG/ML IV BOLUS
INTRAVENOUS | Status: DC | PRN
  Administered 2018-03-17: 150 mg via INTRAVENOUS

## 2018-03-17 MED ORDER — MIDAZOLAM HCL 2 MG/2ML IJ SOLN
INTRAMUSCULAR | Status: AC
  Filled 2018-03-17: qty 2

## 2018-03-17 MED ORDER — ONDANSETRON HCL 4 MG/2ML IJ SOLN
INTRAMUSCULAR | Status: DC | PRN
  Administered 2018-03-17: 4 mg via INTRAVENOUS

## 2018-03-17 MED ORDER — FLEET ENEMA 7-19 GM/118ML RE ENEM
1.0000 | ENEMA | Freq: Once | RECTAL | Status: DC
  Filled 2018-03-17: qty 1

## 2018-03-17 MED ORDER — LIDOCAINE 2% (20 MG/ML) 5 ML SYRINGE
INTRAMUSCULAR | Status: AC
  Filled 2018-03-17: qty 5

## 2018-03-17 MED ORDER — EVICEL 5 ML EX KIT
PACK | Freq: Once | CUTANEOUS | Status: DC
  Filled 2018-03-17: qty 5

## 2018-03-17 MED ORDER — ROCURONIUM BROMIDE 10 MG/ML (PF) SYRINGE
PREFILLED_SYRINGE | INTRAVENOUS | Status: AC
  Filled 2018-03-17: qty 10

## 2018-03-17 MED ORDER — GLYCOPYRROLATE PF 0.2 MG/ML IJ SOSY
PREFILLED_SYRINGE | INTRAMUSCULAR | Status: AC
  Filled 2018-03-17: qty 1

## 2018-03-17 MED ORDER — EVICEL 5 ML EX KIT
PACK | CUTANEOUS | Status: DC | PRN
  Administered 2018-03-17: 5 mL

## 2018-03-17 MED ORDER — BUPIVACAINE-EPINEPHRINE 0.25% -1:200000 IJ SOLN
INTRAMUSCULAR | Status: DC | PRN
  Administered 2018-03-17: 30 mL

## 2018-03-17 MED ORDER — SUGAMMADEX SODIUM 200 MG/2ML IV SOLN
INTRAVENOUS | Status: AC
  Filled 2018-03-17: qty 2

## 2018-03-17 MED ORDER — DEXAMETHASONE SODIUM PHOSPHATE 10 MG/ML IJ SOLN
INTRAMUSCULAR | Status: DC | PRN
  Administered 2018-03-17: 10 mg via INTRAVENOUS

## 2018-03-17 MED ORDER — GABAPENTIN 300 MG PO CAPS
ORAL_CAPSULE | ORAL | Status: AC
  Filled 2018-03-17: qty 1

## 2018-03-17 MED ORDER — FENTANYL CITRATE (PF) 100 MCG/2ML IJ SOLN
INTRAMUSCULAR | Status: AC
  Filled 2018-03-17: qty 2

## 2018-03-17 MED ORDER — HEPARIN SODIUM (PORCINE) 5000 UNIT/ML IJ SOLN
5000.0000 [IU] | Freq: Once | INTRAMUSCULAR | Status: AC
  Administered 2018-03-17: 5000 [IU] via SUBCUTANEOUS
  Filled 2018-03-17: qty 1

## 2018-03-17 MED ORDER — GABAPENTIN 300 MG PO CAPS
300.0000 mg | ORAL_CAPSULE | ORAL | Status: AC
  Administered 2018-03-17: 300 mg via ORAL
  Filled 2018-03-17: qty 1

## 2018-03-17 MED ORDER — ROCURONIUM BROMIDE 10 MG/ML (PF) SYRINGE
PREFILLED_SYRINGE | INTRAVENOUS | Status: DC | PRN
  Administered 2018-03-17: 50 mg via INTRAVENOUS

## 2018-03-17 MED ORDER — PROPOFOL 500 MG/50ML IV EMUL
INTRAVENOUS | Status: AC
  Filled 2018-03-17: qty 50

## 2018-03-17 MED ORDER — ACETAMINOPHEN 500 MG PO TABS
ORAL_TABLET | ORAL | Status: AC
  Filled 2018-03-17: qty 2

## 2018-03-17 MED ORDER — ONDANSETRON HCL 4 MG/2ML IJ SOLN
INTRAMUSCULAR | Status: AC
  Filled 2018-03-17: qty 2

## 2018-03-17 MED ORDER — MIDAZOLAM HCL 2 MG/2ML IJ SOLN
INTRAMUSCULAR | Status: DC | PRN
  Administered 2018-03-17: 2 mg via INTRAVENOUS

## 2018-03-17 MED ORDER — CHLORHEXIDINE GLUCONATE CLOTH 2 % EX PADS
6.0000 | MEDICATED_PAD | Freq: Once | CUTANEOUS | Status: DC
  Filled 2018-03-17: qty 6

## 2018-03-17 MED ORDER — BUPIVACAINE LIPOSOME 1.3 % IJ SUSP
20.0000 mL | Freq: Once | INTRAMUSCULAR | Status: DC
  Filled 2018-03-17: qty 20

## 2018-03-17 MED ORDER — DEXAMETHASONE SODIUM PHOSPHATE 10 MG/ML IJ SOLN
INTRAMUSCULAR | Status: AC
  Filled 2018-03-17: qty 1

## 2018-03-17 MED ORDER — FENTANYL CITRATE (PF) 100 MCG/2ML IJ SOLN
25.0000 ug | INTRAMUSCULAR | Status: DC | PRN
  Filled 2018-03-17: qty 1

## 2018-03-17 MED ORDER — EPHEDRINE SULFATE-NACL 50-0.9 MG/10ML-% IV SOSY
PREFILLED_SYRINGE | INTRAVENOUS | Status: DC | PRN
  Administered 2018-03-17: 10 mg via INTRAVENOUS

## 2018-03-17 MED ORDER — SUGAMMADEX SODIUM 200 MG/2ML IV SOLN
INTRAVENOUS | Status: DC | PRN
  Administered 2018-03-17: 170 mg via INTRAVENOUS

## 2018-03-17 SURGICAL SUPPLY — 54 items
BENZOIN TINCTURE PRP APPL 2/3 (GAUZE/BANDAGES/DRESSINGS) ×8 IMPLANT
BLADE EXTENDED COATED 6.5IN (ELECTRODE) IMPLANT
BLADE HEX COATED 2.75 (ELECTRODE) ×4 IMPLANT
BLADE SURG 10 STRL SS (BLADE) IMPLANT
BLADE SURG 15 STRL LF DISP TIS (BLADE) IMPLANT
BLADE SURG 15 STRL SS (BLADE)
BRIEF STRETCH FOR OB PAD LRG (UNDERPADS AND DIAPERS) ×8 IMPLANT
CANISTER SUCT 3000ML PPV (MISCELLANEOUS) ×4 IMPLANT
COVER BACK TABLE 60X90IN (DRAPES) ×4 IMPLANT
COVER MAYO STAND STRL (DRAPES) ×4 IMPLANT
DECANTER SPIKE VIAL GLASS SM (MISCELLANEOUS) IMPLANT
DRAPE LAPAROTOMY 100X72 PEDS (DRAPES) ×4 IMPLANT
DRAPE UTILITY XL STRL (DRAPES) ×4 IMPLANT
GAUZE SPONGE 4X4 12PLY STRL (GAUZE/BANDAGES/DRESSINGS) ×4 IMPLANT
GAUZE SPONGE 4X4 12PLY STRL LF (GAUZE/BANDAGES/DRESSINGS) ×4 IMPLANT
GLOVE BIO SURGEON STRL SZ7.5 (GLOVE) ×4 IMPLANT
GLOVE BIOGEL PI IND STRL 6.5 (GLOVE) ×2 IMPLANT
GLOVE BIOGEL PI IND STRL 7.5 (GLOVE) ×2 IMPLANT
GLOVE BIOGEL PI INDICATOR 6.5 (GLOVE) ×2
GLOVE BIOGEL PI INDICATOR 7.5 (GLOVE) ×2
GLOVE INDICATOR 8.0 STRL GRN (GLOVE) ×4 IMPLANT
GOWN STRL REUS W/TWL LRG LVL3 (GOWN DISPOSABLE) ×8 IMPLANT
HYDROGEN PEROXIDE 16OZ (MISCELLANEOUS) ×4 IMPLANT
KIT TURNOVER CYSTO (KITS) ×4 IMPLANT
LOOP VESSEL MAXI BLUE (MISCELLANEOUS) IMPLANT
NDL SAFETY ECLIPSE 18X1.5 (NEEDLE) IMPLANT
NEEDLE HYPO 18GX1.5 SHARP (NEEDLE)
NEEDLE HYPO 22GX1.5 SAFETY (NEEDLE) ×4 IMPLANT
NS IRRIG 500ML POUR BTL (IV SOLUTION) ×4 IMPLANT
PACK BASIN DAY SURGERY FS (CUSTOM PROCEDURE TRAY) ×4 IMPLANT
PAD ABD 8X10 STRL (GAUZE/BANDAGES/DRESSINGS) ×4 IMPLANT
PAD ARMBOARD 7.5X6 YLW CONV (MISCELLANEOUS) ×12 IMPLANT
PENCIL BUTTON HOLSTER BLD 10FT (ELECTRODE) ×4 IMPLANT
SPONGE HEMORRHOID 8X3CM (HEMOSTASIS) IMPLANT
SPONGE SURGIFOAM ABS GEL 12-7 (HEMOSTASIS) IMPLANT
SUCTION FRAZIER HANDLE 10FR (MISCELLANEOUS)
SUCTION TUBE FRAZIER 10FR DISP (MISCELLANEOUS) IMPLANT
SUT CHROMIC 2 0 SH (SUTURE) IMPLANT
SUT CHROMIC 3 0 SH 27 (SUTURE) IMPLANT
SUT ETHIBOND 0 (SUTURE) IMPLANT
SUT MNCRL AB 4-0 PS2 18 (SUTURE) IMPLANT
SUT SILK 2 0 (SUTURE)
SUT SILK 2-0 18XBRD TIE 12 (SUTURE) IMPLANT
SUT VIC AB 2-0 SH 27 (SUTURE)
SUT VIC AB 2-0 SH 27XBRD (SUTURE) IMPLANT
SUT VIC AB 3-0 SH 18 (SUTURE) IMPLANT
SUT VIC AB 4-0 P-3 18XBRD (SUTURE) IMPLANT
SUT VIC AB 4-0 P3 18 (SUTURE)
SYR CONTROL 10ML LL (SYRINGE) ×4 IMPLANT
TOWEL OR 17X24 6PK STRL BLUE (TOWEL DISPOSABLE) ×4 IMPLANT
TRAY DSU PREP LF (CUSTOM PROCEDURE TRAY) ×4 IMPLANT
TUBE CONNECTING 12'X1/4 (SUCTIONS) ×1
TUBE CONNECTING 12X1/4 (SUCTIONS) ×3 IMPLANT
YANKAUER SUCT BULB TIP NO VENT (SUCTIONS) ×4 IMPLANT

## 2018-03-17 NOTE — Discharge Instructions (Addendum)
Post Anesthesia Home Care Instructions  Activity: Get plenty of rest for the remainder of the day. A responsible adult should stay with you for 24 hours following the procedure.  For the next 24 hours, DO NOT: -Drive a car -Advertising copywriter -Drink alcoholic beverages -Take any medication unless instructed by your physician -Make any legal decisions or sign important papers.  Meals: Start with liquid foods such as gelatin or soup. Progress to regular foods as tolerated. Avoid greasy, spicy, heavy foods. If nausea and/or vomiting occur, drink only clear liquids until the nausea and/or vomiting subsides. Call your physician if vomiting continues.  Special Instructions/Symptoms: Your throat may feel dry or sore from the anesthesia or the breathing tube placed in your throat during surgery. If this causes discomfort, gargle with warm salt water. The discomfort should disappear within 24 hours.  If you had a scopolamine patch placed behind your ear for the management of post- operative nausea and/or vomiting:  1. The medication in the patch is effective for 72 hours, after which it should be removed.  Wrap patch in a tissue and discard in the trash. Wash hands thoroughly with soap and water. 2. You may remove the patch earlier than 72 hours if you experience unpleasant side effects which may include dry mouth, dizziness or visual disturbances. 3. Avoid touching the patch. Wash your hands with soap and water after contact with the patch.      ANORECTAL SURGERY: POST OP INSTRUCTIONS  1. DIET: Follow a light bland diet the first 24 hours after arrival home, such as soup, liquids, crackers, etc.  Be sure to include lots of fluids daily.  Avoid fast food or heavy meals as your are more likely to get nauseated.  Eat a low fat diet the next few days after surgery.    2. Take your usually prescribed home medications unless otherwise directed.  3. PAIN CONTROL: a. After this procedure, you  shouldn't experience a whole lot of pain. It is helpful to take an over-the-counter pain medication regularly for the first few days/weeks.  Choose from the following that works best for you: i. Ibuprofen (Advil, etc) Three 200mg  tabs every 6 hours as needed. ii. Acetaminophen (Tylenol, etc) 500-650mg  every 6 hours as needed iii. NOTE: You may take both of these medications together - most patients find it most helpful when alternating between the two (i.e. Ibuprofen at 6am, tylenol at 9am, ibuprofen at 12pm ...)  4. Avoid getting constipated.  Between the surgery and the pain medications, it is common to experience some constipation.  Increasing fluid intake (64oz of water per day) and taking a fiber supplement (such as Metamucil, Citrucel, FiberCon) 1-2 times a day regularly will usually help prevent this problem from occurring.  Take Miralax (over the counter) 1-2x/day while taking a narcotic pain medication. If no bowel movement after 48hours, you may additionally take a laxative like a bottle of Milk of Magnesia which can be purchased over the counter. Avoid enemas if possible as these are often painful.   5. Watch out for diarrhea.  If you have many loose bowel movements, simplify your diet to bland foods.  Stop any stool softeners and decrease your fiber supplement. If this worsens or does not improve, please call us.  6. Wash / shower every day.  If you were discharged with a dressing, you may remove this the day after your surgery (or sooner if you need to have a bowel movement). You may shower normally, getting soap/water  on your wound, particularly after bowel movements.  7. Soaking in a warm bath filled a couple inches ("Sitz bath") is a great way to clean the area after a bowel movement and many patients find it is a way to soothe the area.  8. ACTIVITIES as tolerated:   a. You may resume regular (light) daily activities beginning the next day--such as daily self-care, walking, climbing  stairs--gradually increasing activities as tolerated.  If you can walk 30 minutes without difficulty, it is safe to try more intense activity such as jogging, treadmill, bicycling, low-impact aerobics, etc. b. Refrain from any heavy lifting or straining for the first 2 weeks after your procedure, particularly if your surgery was for hemorrhoids. c. Avoid activities that make your pain worse d. You may drive when you are no longer taking prescription pain medication, you can comfortably wear a seatbelt, and you can safely maneuver your car and apply brakes.  9. FOLLOW UP in our office a. Please call [facility] at [phone] to set up an appointment to see your surgeon in the office for a follow-up appointment approximately 2 weeks after your surgery. b. Make sure that you call for this appointment the day you arrive home to insure a convenient appointment time.  9. If you have disability or family leave forms that need to be completed, you may have them completed by your primary care physician's office; for return to work instructions, please ask our office staff and they will be happy to assist you in obtaining this documentation   When to call us [phone]: 1. Poor pain control 2. Reactions / problems with new medications (rash/itching, etc)  3. Fever over 101.5 F (38.5 C) 4. Inability to urinate 5. Nausea/vomiting 6. Worsening swelling or bruising 7. Continued bleeding from incision. 8. Increased pain, redness, or drainage from the incision  The clinic staff is available to answer your questions during regular business hours (8:30am-5pm).  Please dont hesitate to call and ask to speak to one of our nurses for clinical concerns.   A surgeon from [facility] is always on call at the hospitals   If you have a medical emergency, go to the nearest emergency room or call 911.   [facility] [address], [place], [place]  [address] ? MAIN: [phone]  [phone] FAX [phone] [email or web address]

## 2018-03-17 NOTE — H&P (Signed)
CC: Here today for surgery for possible anal fistula  HPI: [name] is a very pleasant 40yoF with hx of DM, HTN, HSV here today for follow-up regarding perianal pain. She first noticed a nodule in her left buttock that developed 11/19/17. She described this is uncomfortable. She was given a course of oral antibiotics thinking this was a possible abscess but nothing seemed to completely go away. She notes this changes in size based on her bowel movements but doesn't appear to be draining any purulent fluid. It does occasionally bleed. She reports having a colonoscopy over in [place] last year and being told there were a couple of benign polyps were removed but it was otherwise unremarkable. She notes that the lesion decreases in size on her bowel movements are loose and gets bigger when she is constipated. She denies any recent fever/chills.  She reports no changes in her health or health hx since being seen in the office 01/30/18 either. Still with intermittent drainage from perianal area.  PMH: DM (well controlled on metformin), HTN (well controlled with spironolactone), HSV (well controlled with prn valacyclovir)  PSH: Denies any prior anorectal procedures  FHx: Denies FHx of malignancy  Social: Denies use of tobacco/EtOH/drugs. She stays at home and helps care for her grandchildren-her son is a General surgery PA with [employer].  ROS: A comprehensive 10 system review of systems was completed with the patient and pertinent findings as noted above.  Past Medical History:  Diagnosis Date  . Acne   . Anal fistula   . Anxiety   . Bicipital tenosynovitis    Left  . Bilateral carpal tunnel syndrome   . Bilateral foot pain   . Chronic neck pain   . Chronic pain of right knee   . Closed fracture of coccyx ([place])   . Diabetes mellitus without complication ([place])   . Enlarged thyroid   . History of colon polyps   . History of hiatal hernia    Small  . History of thyroid cyst    Hemorrhagic cyst left thyroid  . HSV infection   . Hyperlipidemia   . Partial tear of left rotator cuff   . Trigger finger, left   . Trigger finger, right   . Vocal cord cyst     Past Surgical History:  Procedure Laterality Date  . ABDOMINAL HYSTERECTOMY     2010  . CESAREAN SECTION  1990  . CHOLECYSTECTOMY  2009  . COLONOSCOPY W/ POLYPECTOMY  2018  . GASTRIC BYPASS  2010  . SHOULDER SURGERY Left 2017  . TONSILLECTOMY  1980  . TUBAL LIGATION  1992  . VOCAL CORD SURGERY  2016    History reviewed. No pertinent family history.  Social:  reports that she has never smoked. She has never used smokeless tobacco. She reports that she does not drink alcohol or use drugs.  Allergies: No Known Allergies  Medications: I have reviewed the patient's current medications.  Results for orders placed or performed during the hospital encounter of 03/15/18 (from the past 48 hour(s))  APTT     Status: None   Collection Time: 03/15/18  9:19 AM  Result Value Ref Range   aPTT 28 24 - 36 seconds    Comment: Performed at [facility], [place] [address], [place], [place] [address]  CBC WITH DIFFERENTIAL     Status: None   Collection Time: 03/15/18  9:19 AM  Result Value Ref Range   WBC 6.4 4.0 - 10.5 K/uL  RBC 4.65 3.87 - 5.11 MIL/uL   Hemoglobin 13.0 12.0 - 15.0 g/dL   HCT 38.4 36.0 - 46.0 %   MCV 82.6 78.0 - 100.0 fL   MCH 28.0 26.0 - 34.0 pg   MCHC 33.9 30.0 - 36.0 g/dL   RDW 13.5 11.5 - 15.5 %   Platelets 269 150 - 400 K/uL   Neutrophils Relative % 51 %   Neutro Abs 3.3 1.7 - 7.7 K/uL   Lymphocytes Relative 39 %   Lymphs Abs 2.5 0.7 - 4.0 K/uL   Monocytes Relative 6 %   Monocytes Absolute 0.4 0.1 - 1.0 K/uL   Eosinophils Relative 3 %   Eosinophils Absolute 0.2 0.0 - 0.7 K/uL   Basophils Relative 1 %   Basophils Absolute 0.0 0.0 - 0.1 K/uL    Comment: Performed at [facility], [place] [address] [place], [place] [address]  Comprehensive metabolic  panel     Status: Abnormal   Collection Time: 03/15/18  9:19 AM  Result Value Ref Range   Sodium 138 135 - 145 mmol/L   Potassium 3.8 3.5 - 5.1 mmol/L   Chloride 100 98 - 111 mmol/L   CO2 29 22 - 32 mmol/L   Glucose, Bld 266 (H) 70 - 99 mg/dL   BUN 13 6 - 20 mg/dL   Creatinine, Ser 0.67 0.44 - 1.00 mg/dL   Calcium 9.2 8.9 - 10.3 mg/dL   Total Protein 7.0 6.5 - 8.1 g/dL   Albumin 4.0 3.5 - 5.0 g/dL   AST 32 15 - 41 U/L   ALT 35 0 - 44 U/L   Alkaline Phosphatase 84 38 - 126 U/L   Total Bilirubin 0.5 0.3 - 1.2 mg/dL   GFR calc non Af Amer >60 >60 mL/min   GFR calc Af Amer >60 >60 mL/min    Comment: (NOTE) The eGFR has been calculated using the CKD EPI equation. This calculation has not been validated in all clinical situations. eGFR's persistently <60 mL/min signify possible Chronic Kidney Disease.    Anion gap 9 5 - 15    Comment: Performed at [facility], [place] [address], [place], [place] [address]  Protime-INR     Status: None   Collection Time: 03/15/18  9:19 AM  Result Value Ref Range   Prothrombin Time 12.8 11.4 - 15.2 seconds   INR 0.97     Comment: Performed at [facility], [place] [address], [place], [place] [address]    No results found.  ROS - all of the below systems have been reviewed with the patient and positives are indicated with bold text General: chills, fever or night sweats Eyes: blurry vision or double vision ENT: epistaxis or sore throat Allergy/Immunology: itchy/watery eyes or nasal congestion Hematologic/Lymphatic: bleeding problems, blood clots or swollen lymph nodes Endocrine: temperature intolerance or unexpected weight changes Breast: new or changing breast lumps or nipple discharge Resp: cough, shortness of breath, or wheezing CV: chest pain or dyspnea on exertion GI: as per HPI GU: dysuria, trouble voiding, or hematuria MSK: joint pain or joint stiffness Neuro: TIA or stroke symptoms Derm: pruritus and  skin lesion changes Psych: anxiety and depression  PE Blood pressure 112/75, pulse 64, temperature 98.4 F (36.9 C), resp. rate 14, height '5\' 8"'$  (1.727 m), weight 85 kg, SpO2 98 %. Constitutional: NAD; conversant; no deformities Eyes: Moist conjunctiva; no lid lag; anicteric; PERRL Neck: Trachea midline; no thyromegaly Lungs: Normal respiratory effort; no tactile fremitus  CV: RRR; no palpable thrills; no pitting edema GI: Abd soft, NT/ND; no palpable hepatosplenomegaly MSK: Normal gait; no clubbing/cyanosis Psychiatric: Appropriate affect; alert and oriented x3 Lymphatic: No palpable cervical or axillary lymphadenopathy  Results for orders placed or performed during the hospital encounter of 03/15/18 (from the past 48 hour(s))  APTT     Status: None   Collection Time: 03/15/18  9:19 AM  Result Value Ref Range   aPTT 28 24 - 36 seconds    Comment: Performed at [facility], [place] [address] [place], [place] [address]  CBC WITH DIFFERENTIAL     Status: None   Collection Time: 03/15/18  9:19 AM  Result Value Ref Range   WBC 6.4 4.0 - 10.5 K/uL   RBC 4.65 3.87 - 5.11 MIL/uL   Hemoglobin 13.0 12.0 - 15.0 g/dL   HCT 38.4 36.0 - 46.0 %   MCV 82.6 78.0 - 100.0 fL   MCH 28.0 26.0 - 34.0 pg   MCHC 33.9 30.0 - 36.0 g/dL   RDW 13.5 11.5 - 15.5 %   Platelets 269 150 - 400 K/uL   Neutrophils Relative % 51 %   Neutro Abs 3.3 1.7 - 7.7 K/uL   Lymphocytes Relative 39 %   Lymphs Abs 2.5 0.7 - 4.0 K/uL   Monocytes Relative 6 %   Monocytes Absolute 0.4 0.1 - 1.0 K/uL   Eosinophils Relative 3 %   Eosinophils Absolute 0.2 0.0 - 0.7 K/uL   Basophils Relative 1 %   Basophils Absolute 0.0 0.0 - 0.1 K/uL    Comment: Performed at [facility], [place] [address], [place], [place] [address]  Comprehensive metabolic panel     Status: Abnormal   Collection Time: 03/15/18  9:19 AM  Result Value Ref Range   Sodium 138 135 - 145 mmol/L   Potassium 3.8 3.5 - 5.1 mmol/L    Chloride 100 98 - 111 mmol/L   CO2 29 22 - 32 mmol/L   Glucose, Bld 266 (H) 70 - 99 mg/dL   BUN 13 6 - 20 mg/dL   Creatinine, Ser 0.67 0.44 - 1.00 mg/dL   Calcium 9.2 8.9 - 10.3 mg/dL   Total Protein 7.0 6.5 - 8.1 g/dL   Albumin 4.0 3.5 - 5.0 g/dL   AST 32 15 - 41 U/L   ALT 35 0 - 44 U/L   Alkaline Phosphatase 84 38 - 126 U/L   Total Bilirubin 0.5 0.3 - 1.2 mg/dL   GFR calc non Af Amer >60 >60 mL/min   GFR calc Af Amer >60 >60 mL/min    Comment: (NOTE) The eGFR has been calculated using the CKD EPI equation. This calculation has not been validated in all clinical situations. eGFR's persistently <60 mL/min signify possible Chronic Kidney Disease.    Anion gap 9 5 - 15    Comment: Performed at [facility], [place] [address], [place], [place] [address]  Protime-INR     Status: None   Collection Time: 03/15/18  9:19 AM  Result Value Ref Range   Prothrombin Time 12.8 11.4 - 15.2 seconds   INR 0.97     Comment: Performed at [facility], [place] [address] [place], [place] [address]   A/P: Ms. Liston is a very pleasant 49yoF with hx of HTN, DM, HSV here with anal fistula - external opening left anterior  -The anatomy and physiology of the anal canal was discussed at length with the patient  again today. The pathophysiology of anal abscess and fistula was discussed at length with associated pictures and illustrations using the ASCRS handout -We discussed options for treatment moving forward including further observation versus surgery in the office. Surgery would involve examination under anesthesia, possible incision and drainage of perianal abscess, possible fistulotomy, possible seton. We discussed risks of nonoperative management including recurrent perianal abscesses and failure to resolve. She opted to pursue surgery. -The planned procedure, material risks (including, but not limited to, pain, bleeding, infection, scarring, need for blood  transfusion, damage to anal sphincter, incontinence of gas and/or stool, need for additional procedures, recurrence, pneumonia, heart attack, stroke, death) benefits and alternatives to surgery were discussed at length. We discussed that surgery for fistulas can involve multiple operations in order to get to resolve. The patient's questions were answered to her satisfaction, she voiced understanding and elected to proceed with surgery. Additionally, we discussed typical postoperative expectations and the recovery process.  Shanece Mt. Dema Severin, M.D. General and Colorectal Surgery [facility]

## 2018-03-17 NOTE — Anesthesia Preprocedure Evaluation (Addendum)
Anesthesia Evaluation  Patient identified by MRN, date of birth, ID band Patient awake    Reviewed: Allergy & Precautions, NPO status , Patient's Chart, lab work & pertinent test results  Airway Mallampati: II  TM Distance: >3 FB Neck ROM: Full    Dental no notable dental hx. (+) Teeth Intact, Dental Advisory Given   Pulmonary neg pulmonary ROS,    Pulmonary exam normal breath sounds clear to auscultation       Cardiovascular negative cardio ROS Normal cardiovascular exam Rhythm:Regular Rate:Normal     Neuro/Psych Anxiety negative neurological ROS  negative psych ROS   GI/Hepatic Neg liver ROS, Anal fistula   Endo/Other  negative endocrine ROSdiabetes, Type 2, Oral Hypoglycemic Agents  Renal/GU negative Renal ROS  negative genitourinary   Musculoskeletal negative musculoskeletal ROS (+)   Abdominal   Peds  Hematology negative hematology ROS (+)   Anesthesia Other Findings Chronic pain  Reproductive/Obstetrics                            Anesthesia Physical Anesthesia Plan  ASA: II  Anesthesia Plan: General   Post-op Pain Management:    Induction: Intravenous  PONV Risk Score and Plan: 3 and Scopolamine patch - Pre-op, Ondansetron and Dexamethasone  Airway Management Planned: Oral ETT  Additional Equipment:   Intra-op Plan:   Post-operative Plan: Extubation in OR  Informed Consent: I have reviewed the patients History and Physical, chart, labs and discussed the procedure including the risks, benefits and alternatives for the proposed anesthesia with the patient or authorized representative who has indicated his/her understanding and acceptance.   Dental advisory given  Plan Discussed with: CRNA  Anesthesia Plan Comments:         Anesthesia Quick Evaluation

## 2018-03-17 NOTE — Op Note (Signed)
03/17/2018  10:28 AM  PATIENT:  Diane Hancock  52 y.o. female  Patient Care Team: System, Pcp Not In as PCP - General  PRE-OPERATIVE DIAGNOSIS:  Anal fistula  POST-OPERATIVE DIAGNOSIS:  Anal fistula  PROCEDURE:   1. Injection of anal fistulous tract/sinus with Evicel (glue) 2. Anorectal exam under anesthesia  SURGEON:  Surgeon(s): Andria Meuse, MD  ASSISTANT: none   ANESTHESIA:   general  SPECIMEN:  No Specimen  DISPOSITION OF SPECIMEN:  N/A  COUNTS: Sponge, needle, and instrument counts were reported correct x2.  PLAN OF CARE: Discharge to home after PACU  PATIENT DISPOSITION:  PACU - hemodynamically stable.  INDICATION: Diane Hancock is a very pleasant 51yoF with hx of DM, HTN, HSV here today for follow-up regarding perianal pain. She first noticed a nodule in her left buttock that developed 11/19/17. She described this is uncomfortable. She was given a course of oral antibiotics thinking this was a possible abscess but nothing seemed to completely go away. She notes this changes in size based on her bowel movements but doesn't appear to be draining any purulent fluid. It does occasionally bleed. She reports having a colonoscopy over in [place] last year and being told there were a couple of benign polyps were removed but it was otherwise unremarkable. She notes that the lesion decreases in size on her bowel movements are loose and gets bigger when she is constipated. She denies any recent fever/chills.  On exam, there was a chronic appearing opening consistent with an external opening of the fistula in the left anterior lateral position.  Options were discussed moving forward given the chronicity of this area, I suspect that she had an underlying fistulous tract.  She opted to pursue surgery.  Please refer to H&P for details regarding this discussion.   OR FINDINGS: External opening consistent with the opening of a fistula in the left anterior lateral position.  No  abnormalities on digital rectal exam nor circumferential anoscopy aside from small internal and external hemorrhoids.  The external opening was probed gently multiple times but no definite communication with the anus was identified.  The external opening was approximately 4 cm from the anal verge.  Hydrogen peroxide was injected into the external opening and no communication with the anal canal was identifiable.  Given these findings, the wound was curetted and following this injected with Evicel to help facilitate healing.  DESCRIPTION: The patient was identified in the preoperative holding area and taken to the OR where they were placed on the operating room table. SCDs were placed.  General endotracheal anesthesia was induced without difficulty. The patient was then positioned in prone jackknife position with buttocks gently taped apart.  All pressure points were then padded and verified.  The patient was then prepped and draped in usual sterile fashion.  A surgical timeout was performed indicating the correct patient, procedure, positioning and need for preoperative antibiotics.  A pudendal block was performed using a dilute mixture of 0.25% Marcaine/Epi with Exparel.   Externally, there was findings as noted above with findings consistent with an external opening of the fistulous tract in the left anterolateral position approximately 4 cm in the anal verge.  There were small external hemorrhoids.  A digital rectal exam was performed and no palpable abnormalities were noted.  Circumferential anoscopy was then performed and also normal.  There was no definitive internal opening identified but she did have small internal hemorrhoids.  The external opening was gently probed using a lacrimal probe  and no definitive communication of the anal canal nor rectum was identifiable.  The tract was then injected with hydrogen peroxide and no identifiable communication with the anal canal or rectum was identified.   Following this, the tract was subsequently curetted to remove the granulation tissue.  The external opening was landscape to slightly increase the diameter of the opening.  The tract was then injected with Evicel to serve as a scaffolding for subsequent healing.  Hemostasis was verified.  Counts were reported correct x2.  4 x 4 gauze were then placed over the perianal area followed by an ABD pad and secured with mesh underwear.  The patient was then taken out of the prone jackknife position, rolled onto a stretcher, extubated, and transferred to PACU in satisfactory condition.  DISPOSITION: PACU in satisfactory condition.

## 2018-03-17 NOTE — Anesthesia Procedure Notes (Signed)
Procedure Name: Intubation Date/Time: 03/17/2018 9:32 AM Performed by: Suan Halter, CRNA Pre-anesthesia Checklist: Patient identified, Emergency Drugs available, Suction available and Patient being monitored Patient Re-evaluated:Patient Re-evaluated prior to induction Oxygen Delivery Method: Circle system utilized Preoxygenation: Pre-oxygenation with 100% oxygen Induction Type: IV induction Ventilation: Mask ventilation without difficulty Laryngoscope Size: Mac and 3 Grade View: Grade I Tube type: Oral Tube size: 7.0 mm Number of attempts: 1 Airway Equipment and Method: Stylet and Oral airway Placement Confirmation: ETT inserted through vocal cords under direct vision,  positive ETCO2 and breath sounds checked- equal and bilateral Secured at: 21 cm Tube secured with: Tape Dental Injury: Teeth and Oropharynx as per pre-operative assessment

## 2018-03-17 NOTE — Transfer of Care (Signed)
Immediate Anesthesia Transfer of Care Note  Patient: Diane Hancock  Procedure(s) Performed: Procedure(s) (LRB): ANAL EXAM UNDER ANESTHESIA: Injection chronic sinus cavity (N/A)  Patient Location: PACU  Anesthesia Type: General  Level of Consciousness: awake, oriented, sedated and patient cooperative  Airway & Oxygen Therapy: Patient Spontanous Breathing and Patient connected to face mask oxygen  Post-op Assessment: Report given to PACU RN and Post -op Vital signs reviewed and stable  Post vital signs: Reviewed and stable  Complications: No apparent anesthesia complications Last Vitals:  Vitals Value Taken Time  BP 144/75 03/17/2018 10:34 AM  Temp 36.8 C 03/17/2018 10:34 AM  Pulse 80 03/17/2018 10:38 AM  Resp 18 03/17/2018 10:38 AM  SpO2 96 % 03/17/2018 10:38 AM  Vitals shown include unvalidated device data.  Last Pain:  Vitals:   03/17/18 1034  PainSc: Asleep      Patients Stated Pain Goal: 7 (03/17/18 0748)

## 2018-03-19 NOTE — Anesthesia Postprocedure Evaluation (Signed)
Anesthesia Post Note  Patient: Diane Hancock  Procedure(s) Performed: ANAL EXAM UNDER ANESTHESIA: Injection chronic sinus cavity (N/A Rectum)     Patient location during evaluation: PACU Anesthesia Type: General Level of consciousness: awake and alert Pain management: pain level controlled Vital Signs Assessment: post-procedure vital signs reviewed and stable Respiratory status: spontaneous breathing, nonlabored ventilation, respiratory function stable and patient connected to nasal cannula oxygen Cardiovascular status: blood pressure returned to baseline and stable Postop Assessment: no apparent nausea or vomiting Anesthetic complications: no    Last Vitals:  Vitals:   03/17/18 1100 03/17/18 1145  BP: 125/65 122/67  Pulse: 77 62  Resp: (!) 21 18  Temp: (!) 36.4 C 36.7 C  SpO2: 99% 98%    Last Pain:  Vitals:   03/17/18 1130  PainSc: 0-No pain                 Naveed Humphres L Yariel Ferraris

## 2018-04-05 ENCOUNTER — Ambulatory Visit (INDEPENDENT_AMBULATORY_CARE_PROVIDER_SITE_OTHER): Payer: BLUE CROSS/BLUE SHIELD | Admitting: Orthopaedic Surgery

## 2018-04-19 DIAGNOSIS — M79676 Pain in unspecified toe(s): Secondary | ICD-10-CM

## 2018-06-13 ENCOUNTER — Other Ambulatory Visit: Payer: Self-pay | Admitting: Surgery

## 2018-06-13 DIAGNOSIS — K603 Anal fistula: Secondary | ICD-10-CM

## 2018-06-26 ENCOUNTER — Ambulatory Visit
Admission: RE | Admit: 2018-06-26 | Discharge: 2018-06-26 | Disposition: A | Discharge status: Home / Self Care | Payer: BLUE CROSS/BLUE SHIELD | Source: Ambulatory Visit | Attending: Surgery | Admitting: Surgery

## 2018-06-26 ENCOUNTER — Other Ambulatory Visit: Payer: BLUE CROSS/BLUE SHIELD

## 2018-06-26 DIAGNOSIS — K603 Anal fistula: Secondary | ICD-10-CM

## 2018-06-26 MED ORDER — GADOBENATE DIMEGLUMINE 529 MG/ML IV SOLN
18.0000 mL | Freq: Once | INTRAVENOUS | Status: AC | PRN
  Administered 2018-06-26: 18 mL via INTRAVENOUS

## 2018-06-27 ENCOUNTER — Encounter: Payer: Self-pay | Admitting: Sports Medicine

## 2018-06-27 ENCOUNTER — Ambulatory Visit: Payer: BLUE CROSS/BLUE SHIELD | Admitting: Sports Medicine

## 2018-06-27 DIAGNOSIS — E1141 Type 2 diabetes mellitus with diabetic mononeuropathy: Secondary | ICD-10-CM

## 2018-06-27 DIAGNOSIS — M79671 Pain in right foot: Secondary | ICD-10-CM

## 2018-06-27 DIAGNOSIS — M79672 Pain in left foot: Secondary | ICD-10-CM | POA: Diagnosis not present

## 2018-06-27 NOTE — Progress Notes (Signed)
  Subjective: Diane Hancock is a 52 y.o. female patient who presents to office for followup evaluation of bilateral foot pain numbness, tingling. Reports nerve symptoms are the same. Had anal issues and was given Gabapentin which helps. Denies any other pedal complaints.   Patient Active Problem List   Diagnosis Date Noted  . Bilateral hand pain 09/27/2017  . Carpal tunnel syndrome, bilateral 09/27/2017  . Trigger little finger of left hand 09/13/2017  . Biceps tendinitis of left shoulder 08/02/2015  . Diabetes mellitus type 2, uncomplicated ([place]) 56/43/3295  . Contusion of left knee 04/17/2015  . Sprain of tibiofibular ligament of right ankle 04/17/2015  . Incomplete tear of rotator cuff 03/25/2015    Current Outpatient Medications on File Prior to Visit  Medication Sig Dispense Refill  . escitalopram (LEXAPRO) 10 MG tablet Take by mouth every evening.     . fluticasone (FLONASE) 50 MCG/ACT nasal spray Place 2 sprays into both nostrils daily.     . metFORMIN (GLUCOPHAGE) 500 MG tablet Take by mouth 2 (two) times daily with a meal.    . methimazole (TAPAZOLE) 5 MG tablet Take by mouth every evening.     . rosuvastatin (CRESTOR) 5 MG tablet Take 5 mg by mouth 3 (three) times a week.    . spironolactone (ALDACTONE) 50 MG tablet every evening.   0  . valACYclovir (VALTREX) 1000 MG tablet as needed.   1   No current facility-administered medications on file prior to visit.     No Known Allergies  Objective:  General: Alert and oriented x3 in no acute distress Integument: Skin is warm, dry and supple bilateral. Nails are within normal limits bilateral. No signs of infection. No open lesions. + minimal callus right foot sub met 2. Remaining integument unremarkable.  Vasculature: Dorsalis Pedis pulse 2/4 bilateral. Posterior Tibial pulse 2 /4 bilateral. Capillary fill time <3 sec 1-5 bilateral. Positive hair growth to the level of the digits.Temperature gradient within normal limits. No  varicosities present bilateral. No edema present bilateral.   Neurology: The patient has intact sensation measured with a 5.07/10g Semmes Weinstein Monofilament at all pedal sites bilateral . Vibratory sensation diminished bilateral with tuning fork. No Babinski sign present bilateral.   Musculoskeletal: Asymptomatic bunion, mild hammertoe, and occasional pain to balls to feet noted bilateral. Muscular strength 5/5 in all lower extremity muscular groups bilateral without pain on range of motion. No tenderness with calf compression bilateral.    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  Landis Martins, DPM

## 2018-07-12 ENCOUNTER — Encounter (INDEPENDENT_AMBULATORY_CARE_PROVIDER_SITE_OTHER): Payer: Self-pay | Admitting: Orthopaedic Surgery

## 2018-07-14 ENCOUNTER — Ambulatory Visit (INDEPENDENT_AMBULATORY_CARE_PROVIDER_SITE_OTHER): Payer: BLUE CROSS/BLUE SHIELD | Admitting: Orthopaedic Surgery

## 2018-07-20 ENCOUNTER — Ambulatory Visit (INDEPENDENT_AMBULATORY_CARE_PROVIDER_SITE_OTHER): Payer: BLUE CROSS/BLUE SHIELD | Admitting: Family Medicine

## 2018-09-23 ENCOUNTER — Encounter: Payer: Self-pay | Admitting: Sports Medicine

## 2018-09-25 ENCOUNTER — Encounter: Payer: Self-pay | Admitting: Sports Medicine

## 2019-01-02 ENCOUNTER — Telehealth: Payer: Self-pay | Admitting: Sports Medicine

## 2019-01-03 NOTE — Telephone Encounter (Signed)
I'm calling to check the status of the medical records request.

## 2019-12-16 IMAGING — MR MR PELVIS WO/W CM
6 of 8 series · 33 of 48 positions shown · IV contrast (multihance)
Comparison: None.

CLINICAL DATA: Anal fistula with previous surgical repair 3 months
ago. Recurrent rectal pain and itching.

Creatinine was obtained on site at [HOSPITAL] at [HOSPITAL].
Results: Creatinine 0.9 mg/dL.
EXAM:
MRI PELVIS WITHOUT AND WITH CONTRAST
TECHNIQUE: Multiplanar multisequence MR imaging of the pelvis was performed
both before and after administration of intravenous contrast.
CONTRAST:  18mL MULTIHANCE GADOBENATE DIMEGLUMINE 529 MG/ML IV SOLN

[Series 2: T2 · sagittal · 2.5mm · 0.81mm/px · 8 of 38 slices shown (1 of 2)]
[im 1/38]
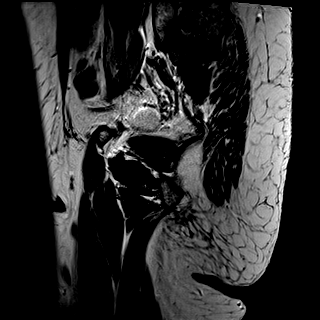
[im 6/38]
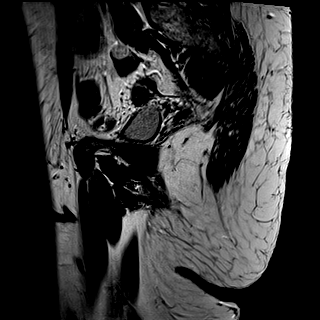
[im 11/38]
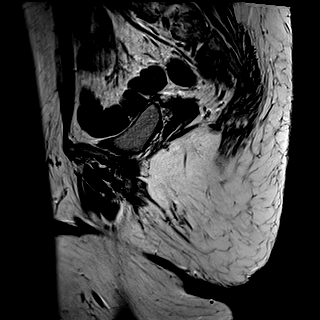
[im 16/38]
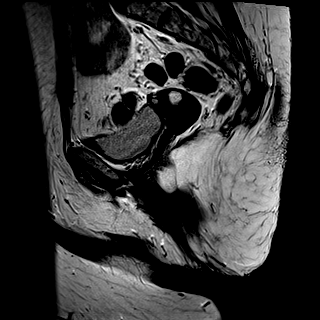
[im 22/38]
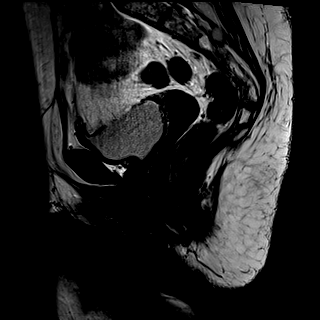
[im 27/38]
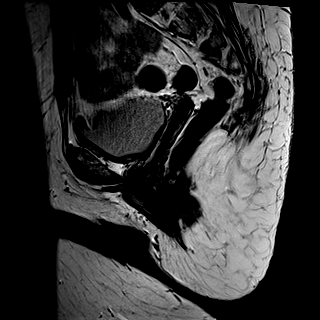
[im 32/38]
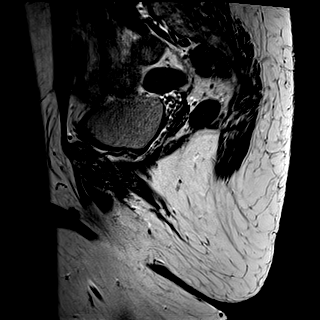
[im 38/38]
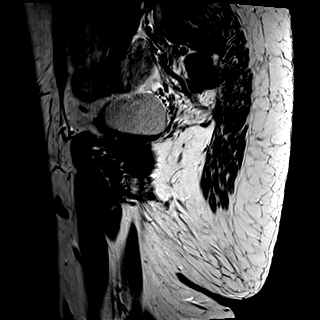

[Series 5: T1 · axial · 4.0mm · 0.57mm/px · z∈[-188,-30]mm · 7 of 36 slices shown]
[im 1/36]
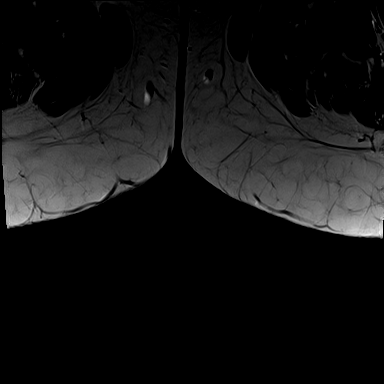
[im 6/36]
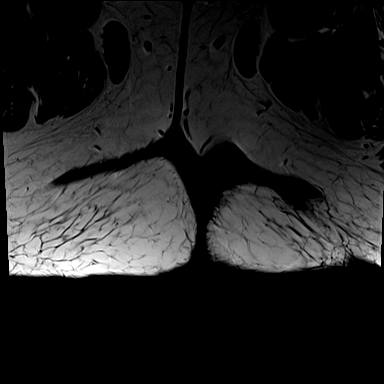
[im 12/36]
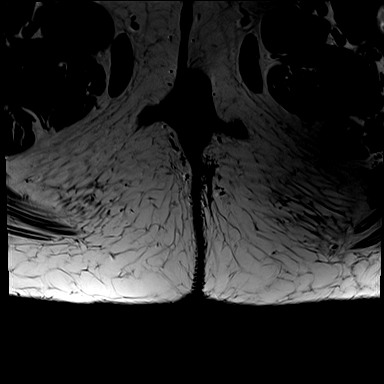
[im 18/36]
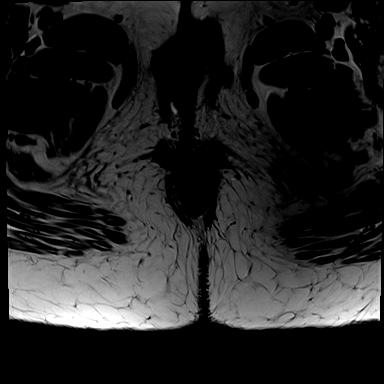
[im 24/36]
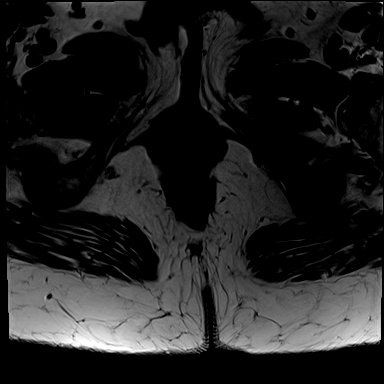
[im 30/36]
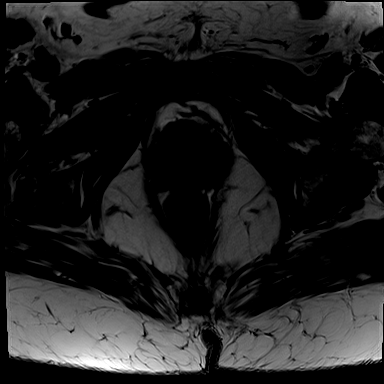
[im 36/36]
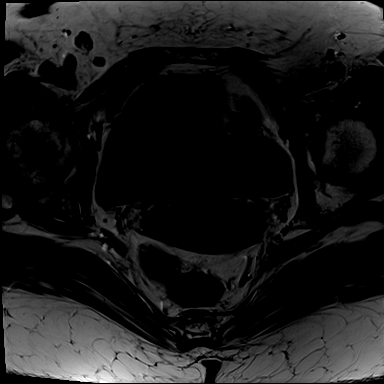

[Series 6: T2 · axial · 4.0mm · 0.57mm/px · z∈[-188,-30]mm · 6 of 36 slices shown (2 of 2)]
[im 1/36]
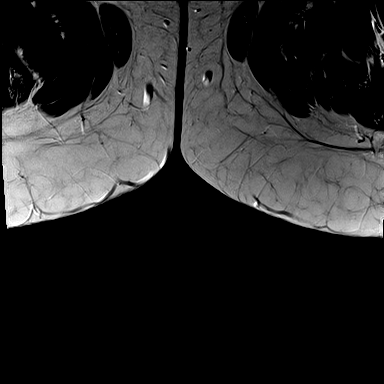
[im 8/36]
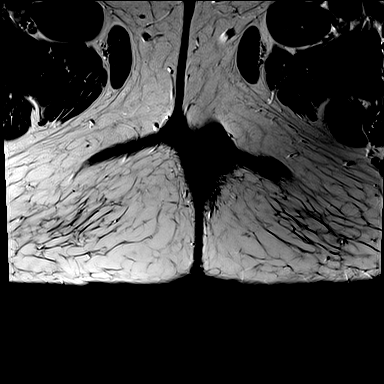
[im 15/36]
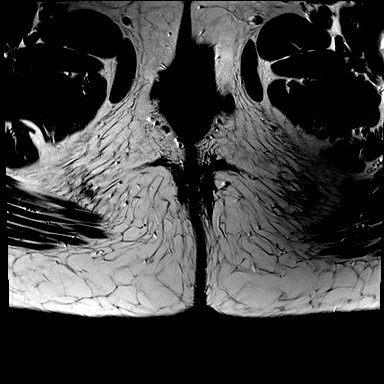
[im 22/36]
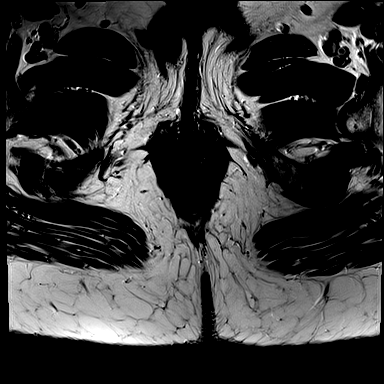
[im 29/36]
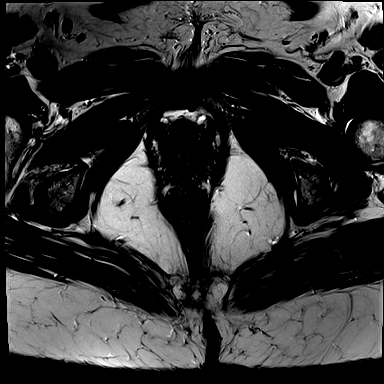
[im 36/36]
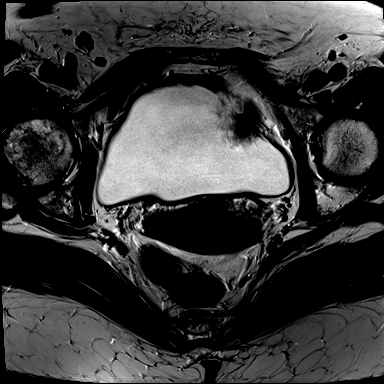

[Series 7: T2 fat-sat · axial · 4.0mm · 0.57mm/px · z∈[-188,-30]mm · 6 of 36 slices shown (1 of 2)]
[im 1/36]
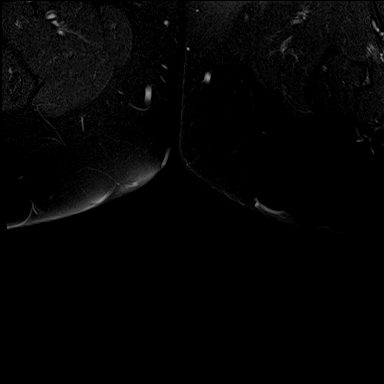
[im 8/36]
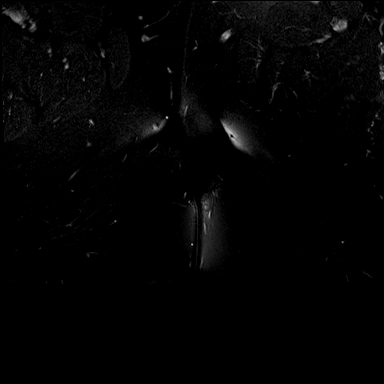
[im 15/36]
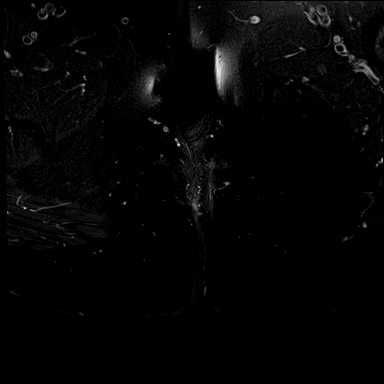
[im 22/36]
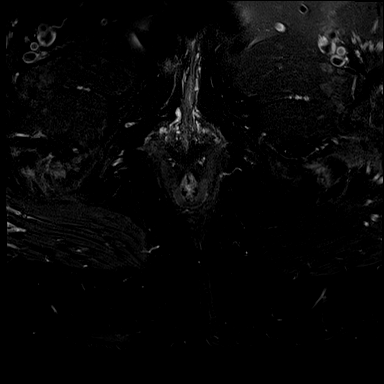
[im 29/36]
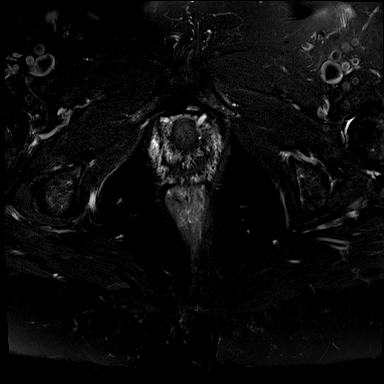
[im 36/36]
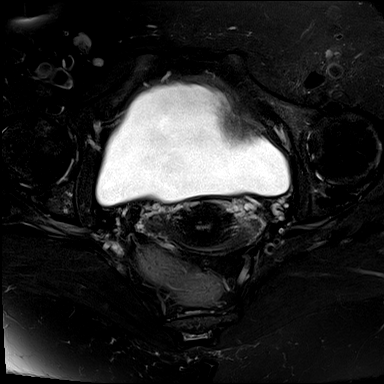

[Series 8: T2 fat-sat · coronal · 4.0mm · 0.75mm/px · 4 of 25 slices shown (2 of 2)]
[im 1/25]
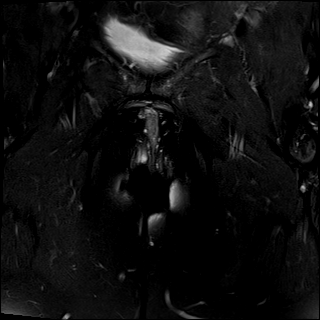
[im 9/25]
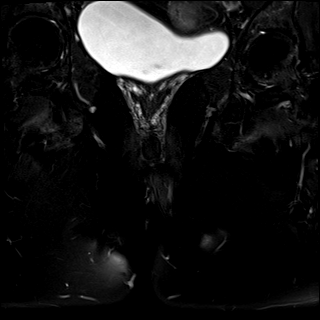
[im 17/25]
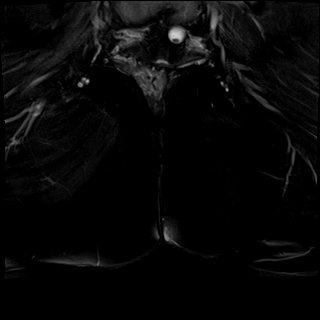
[im 25/25]
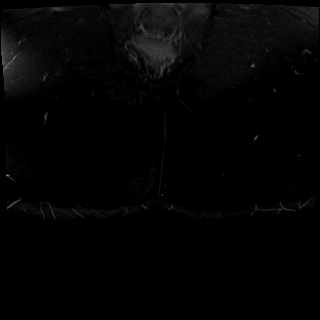

[Series 9: T1 fat-sat post-contrast · coronal · 4.0mm · 0.38mm/px · 2 of 25 slices shown]
[im 1/25]
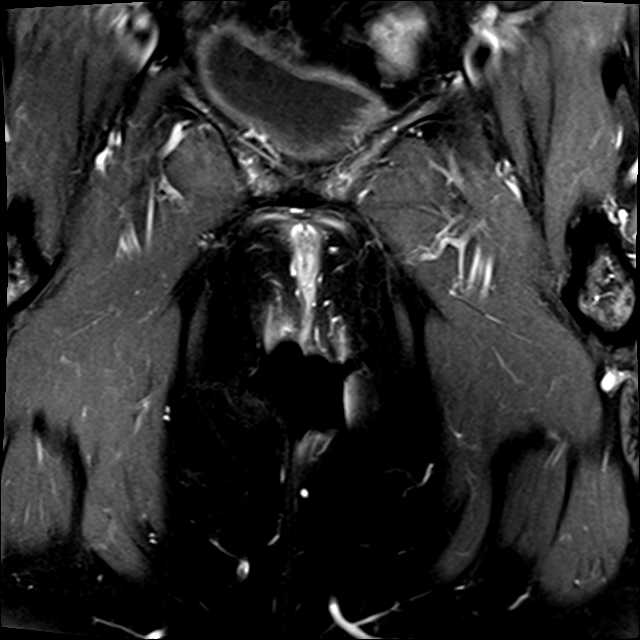
[im 9/25]
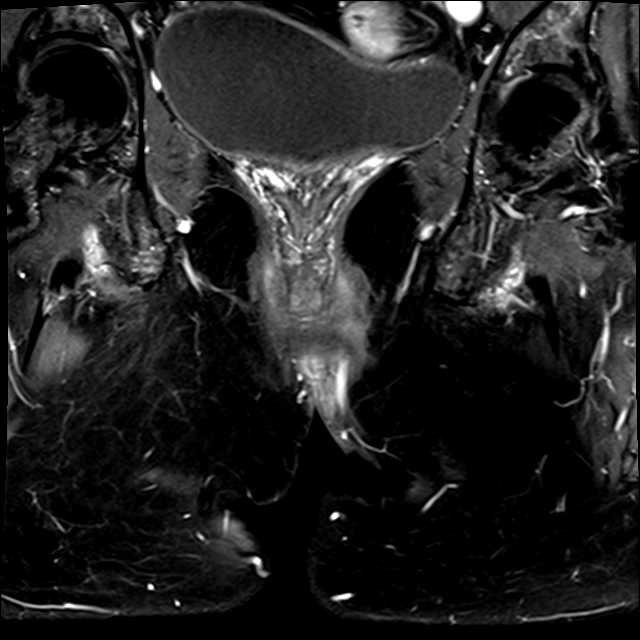

[33 of 48 positions shown; findings below may reference images not displayed]

FINDINGS: Urinary Tract: Unremarkable urinary bladder.

Bowel: A simple enhancing fistulous tract is seen arising in the
intersphincteric plane of the left lateral wall of the inferior
anus, and extending inferiorly in the left glottic to the skin
surface in the gluteal crease. No evidence of associated abscess.

Vascular/Lymphatic: No pathologically enlarged lymph nodes or other
significant abnormality.

Reproductive:  Postop changes from supracervical hysterectomy.

Other: None.

Musculoskeletal: No significant abnormality identified.
IMPRESSION: Simple left-sided intersphincteric anal fistula, which extends
inferiorly to the skin surface within the gluteal crease. No
evidence of associated abscess.

## 2022-01-19 ENCOUNTER — Ambulatory Visit: Payer: Self-pay

## 2022-01-19 NOTE — Telephone Encounter (Signed)
  Chief Complaint: Anxiety Symptoms: Sadness, anxious, worried Frequency: ongoing Pertinent Negatives: Patient denies Self harm Disposition: [] ED /[x] Urgent Care (no appt availability in office) / [] Appointment(In office/virtual)/ []  [facility]/ [] Home Care/ [] Refused Recommended Disposition /[] [facility]/ []  Follow-up with PCP Additional Notes: Pt was seen at [facility] today. Pt has a lot of anxiety regarding recent events which has made anxiety worse.  Bankruptcy, loss of employment. Losing home. PT also has physical health issues of diabetes and no thyroid.   Pt went to a facility in [place] today that was unable to help her as she is not a resident of . Gave pt number to [facility] behavioral health. PT will also call health dept for help until she is able to see new PCP. Pt will call back if she is unable to find help today.  Reason for Disposition  Patient sounds very upset or troubled to the triager  Answer Assessment - Initial Assessment Questions 1. CONCERN: "Did anything happen that prompted you to call today?"      At behavioral health - unable to get help there 2. ANXIETY SYMPTOMS: "Can you describe how you (your loved one; patient) have been feeling?" (e.g., tense, restless, panicky, anxious, keyed up, overwhelmed, sense of impending doom).      Pending bankruptcy, medical bills no employment 3. ONSET: "How long have you been feeling this way?" (e.g., hours, days, weeks)     A long time 4. SEVERITY: "How would you rate the level of anxiety?" (e.g., 0 - 10; or mild, moderate, severe).     severe 5. FUNCTIONAL IMPAIRMENT: "How have these feelings affected your ability to do daily activities?" "Have you had more difficulty than usual doing your normal daily activities?" (e.g., getting better, same, worse; self-care, school, work, interactions)     yeys 6. HISTORY: "Have you felt this way before?" "Have you ever been diagnosed with  an anxiety problem in the past?" (e.g., generalized anxiety disorder, panic attacks, PTSD). If Yes, ask: "How was this problem treated?" (e.g., medicines, counseling, etc.)     yes 7. RISK OF HARM - SUICIDAL IDEATION: "Do you ever have thoughts of hurting or killing yourself?" If Yes, ask:  "Do you have these feelings now?" "Do you have a plan on how you would do this?"     no 8. TREATMENT:  "What has been done so far to treat this anxiety?" (e.g., medicines, relaxation strategies). "What has helped?"     Behavioral health 9. TREATMENT - THERAPIST: "Do you have a counselor or therapist? Name?"     No currently 10. POTENTIAL TRIGGERS: "Do you drink caffeinated beverages (e.g., coffee, colas, teas), and how much daily?" "Do you drink alcohol or use any drugs?" "Have you started any new medicines recently?"      10. PATIENT SUPPORT: "Who is with you now?" "Who do you live with?" "Do you have family or friends who you can talk to?"        Family - husband 74. OTHER SYMPTOMS: "Do you have any other symptoms?" (e.g., feeling depressed, trouble concentrating, trouble sleeping, trouble breathing, palpitations or fast heartbeat, chest pain, sweating, nausea, or diarrhea)       No thyroid, diabetic 12. PREGNANCY: "Is there any chance you are pregnant?" "When was your last menstrual period?"       na  Protocols used: Anxiety and Panic Attack-A-AH

## 2022-05-12 ENCOUNTER — Ambulatory Visit: Payer: Self-pay | Admitting: Critical Care Medicine
